# Patient Record
Sex: Female | Born: 1960
Health system: Southern US, Community
[De-identification: ages and names within clinical notes are randomized; demographics above are authoritative.]

## PROBLEM LIST (undated history)

## (undated) DIAGNOSIS — G43909 Migraine, unspecified, not intractable, without status migrainosus: Secondary | ICD-10-CM

## (undated) DIAGNOSIS — K3184 Gastroparesis: Secondary | ICD-10-CM

## (undated) DIAGNOSIS — A048 Other specified bacterial intestinal infections: Secondary | ICD-10-CM

## (undated) DIAGNOSIS — T7840XA Allergy, unspecified, initial encounter: Secondary | ICD-10-CM

## (undated) DIAGNOSIS — F419 Anxiety disorder, unspecified: Secondary | ICD-10-CM

## (undated) DIAGNOSIS — K219 Gastro-esophageal reflux disease without esophagitis: Secondary | ICD-10-CM

## (undated) DIAGNOSIS — K311 Adult hypertrophic pyloric stenosis: Secondary | ICD-10-CM

## (undated) HISTORY — DX: Migraine, unspecified, not intractable, without status migrainosus: G43.909

## (undated) HISTORY — DX: Allergy, unspecified, initial encounter: T78.40XA

## (undated) HISTORY — PX: OTHER SURGICAL HISTORY: SHX169

## (undated) HISTORY — DX: Other specified bacterial intestinal infections: A04.8

## (undated) HISTORY — PX: BACK SURGERY: SHX140

## (undated) HISTORY — DX: Adult hypertrophic pyloric stenosis: K31.1

---

## 1985-12-29 HISTORY — PX: AUGMENTATION MAMMAPLASTY: SUR837

## 1998-03-08 ENCOUNTER — Inpatient Hospital Stay (HOSPITAL_COMMUNITY): Admission: AD | Admit: 1998-03-08 | Discharge: 1998-03-10 | Payer: Self-pay | Admitting: Obstetrics and Gynecology

## 1999-03-23 ENCOUNTER — Emergency Department (HOSPITAL_COMMUNITY): Admission: EM | Admit: 1999-03-23 | Discharge: 1999-03-23 | Payer: Self-pay | Admitting: Emergency Medicine

## 1999-04-22 ENCOUNTER — Ambulatory Visit (HOSPITAL_BASED_OUTPATIENT_CLINIC_OR_DEPARTMENT_OTHER): Admission: RE | Admit: 1999-04-22 | Discharge: 1999-04-22 | Payer: Self-pay | Admitting: *Deleted

## 1999-09-25 ENCOUNTER — Other Ambulatory Visit: Admission: RE | Admit: 1999-09-25 | Discharge: 1999-09-25 | Payer: Self-pay | Admitting: Obstetrics & Gynecology

## 2000-10-22 ENCOUNTER — Other Ambulatory Visit: Admission: RE | Admit: 2000-10-22 | Discharge: 2000-10-22 | Payer: Self-pay | Admitting: Obstetrics & Gynecology

## 2001-11-04 ENCOUNTER — Other Ambulatory Visit: Admission: RE | Admit: 2001-11-04 | Discharge: 2001-11-04 | Payer: Self-pay | Admitting: Obstetrics & Gynecology

## 2003-05-30 ENCOUNTER — Other Ambulatory Visit: Admission: RE | Admit: 2003-05-30 | Discharge: 2003-05-30 | Payer: Self-pay | Admitting: Obstetrics & Gynecology

## 2004-07-24 ENCOUNTER — Other Ambulatory Visit: Admission: RE | Admit: 2004-07-24 | Discharge: 2004-07-24 | Payer: Self-pay | Admitting: Obstetrics & Gynecology

## 2006-02-24 ENCOUNTER — Other Ambulatory Visit: Admission: RE | Admit: 2006-02-24 | Discharge: 2006-02-24 | Payer: Self-pay | Admitting: Obstetrics & Gynecology

## 2010-07-08 ENCOUNTER — Ambulatory Visit: Payer: Self-pay | Admitting: Family Medicine

## 2010-08-07 ENCOUNTER — Ambulatory Visit: Payer: Self-pay | Admitting: Family Medicine

## 2011-07-03 ENCOUNTER — Telehealth: Payer: Self-pay | Admitting: Family Medicine

## 2011-07-03 NOTE — Telephone Encounter (Signed)
Patient was stung by yellow jacket on R lateral ankle 2 days ago.  Yesterday she noted a penny-sized blister at the sting site.  This afternoon she noted that her ankle appeared to be swollen to twice the normal size.  Denies any fever, redness or warmth over the swollen area.  States the area of the sting is a little itchy, denies pain.  Has had her leg somewhat elevated while reading documents today.  Had applied some deoderant over the sting, but no other treatment. (used same deoderant she uses normally).  Patient was advised to ice the area, elevate the ankle above the level of the heart, and to take antihistamines such as Zyrtec or Benadryl.  If worsening, needs evaluation.  Can call office for appt here tomorrow, vs to urgent care if worsening tonight.  Reviewed signs/symptoms of anaphylaxis for which she should seek emergent medical care

## 2011-08-27 ENCOUNTER — Encounter: Payer: Self-pay | Admitting: Family Medicine

## 2012-03-29 DIAGNOSIS — A048 Other specified bacterial intestinal infections: Secondary | ICD-10-CM

## 2012-03-29 DIAGNOSIS — K311 Adult hypertrophic pyloric stenosis: Secondary | ICD-10-CM

## 2012-03-29 HISTORY — PX: ESOPHAGOGASTRODUODENOSCOPY: SHX1529

## 2012-03-29 HISTORY — PX: COLONOSCOPY: SHX174

## 2012-03-29 HISTORY — DX: Adult hypertrophic pyloric stenosis: K31.1

## 2012-03-29 HISTORY — DX: Other specified bacterial intestinal infections: A04.8

## 2012-04-02 ENCOUNTER — Ambulatory Visit
Admission: RE | Admit: 2012-04-02 | Discharge: 2012-04-02 | Disposition: A | Discharge status: Home / Self Care | Payer: 59 | Source: Ambulatory Visit | Attending: Gastroenterology | Admitting: Gastroenterology

## 2012-04-02 ENCOUNTER — Ambulatory Visit
Admission: RE | Admit: 2012-04-02 | Discharge: 2012-04-02 | Disposition: A | Discharge status: Home / Self Care | Payer: Self-pay | Source: Ambulatory Visit | Attending: Gastroenterology | Admitting: Gastroenterology

## 2012-04-02 ENCOUNTER — Other Ambulatory Visit: Payer: Self-pay | Admitting: Gastroenterology

## 2012-04-02 DIAGNOSIS — R112 Nausea with vomiting, unspecified: Secondary | ICD-10-CM

## 2012-04-12 ENCOUNTER — Ambulatory Visit
Admission: RE | Admit: 2012-04-12 | Discharge: 2012-04-12 | Disposition: A | Discharge status: Home / Self Care | Payer: 59 | Source: Ambulatory Visit | Attending: Gastroenterology | Admitting: Gastroenterology

## 2012-04-12 ENCOUNTER — Other Ambulatory Visit: Payer: Self-pay | Admitting: Gastroenterology

## 2012-04-12 MED ORDER — IOHEXOL 300 MG/ML  SOLN
100.0000 mL | Freq: Once | INTRAMUSCULAR | Status: AC | PRN
  Administered 2012-04-12: 100 mL via INTRAVENOUS

## 2012-04-12 MED ORDER — IOHEXOL 300 MG/ML  SOLN
40.0000 mL | INTRAMUSCULAR | Status: AC
  Administered 2012-04-12: 40 mL via ORAL

## 2012-04-19 ENCOUNTER — Ambulatory Visit (INDEPENDENT_AMBULATORY_CARE_PROVIDER_SITE_OTHER): Payer: Self-pay | Admitting: General Surgery

## 2012-04-21 ENCOUNTER — Encounter: Payer: Self-pay | Admitting: Family Medicine

## 2012-04-28 ENCOUNTER — Encounter: Payer: Self-pay | Admitting: Internal Medicine

## 2012-07-22 ENCOUNTER — Ambulatory Visit: Payer: Self-pay | Admitting: General Surgery

## 2012-07-29 ENCOUNTER — Ambulatory Visit: Payer: Self-pay | Admitting: General Surgery

## 2012-10-18 ENCOUNTER — Other Ambulatory Visit: Payer: Self-pay | Admitting: Gastroenterology

## 2012-10-18 DIAGNOSIS — R109 Unspecified abdominal pain: Secondary | ICD-10-CM

## 2012-10-20 ENCOUNTER — Ambulatory Visit
Admission: RE | Admit: 2012-10-20 | Discharge: 2012-10-20 | Disposition: A | Discharge status: Home / Self Care | Payer: 59 | Source: Ambulatory Visit | Attending: Gastroenterology | Admitting: Gastroenterology

## 2012-10-20 DIAGNOSIS — R109 Unspecified abdominal pain: Secondary | ICD-10-CM

## 2012-11-10 ENCOUNTER — Other Ambulatory Visit: Payer: Self-pay | Admitting: Gastroenterology

## 2012-11-19 ENCOUNTER — Encounter (HOSPITAL_COMMUNITY)
Admission: RE | Disposition: A | Discharge status: Home / Self Care | Payer: Self-pay | Source: Ambulatory Visit | Attending: Gastroenterology

## 2012-11-19 ENCOUNTER — Ambulatory Visit (HOSPITAL_COMMUNITY): Payer: 59

## 2012-11-19 ENCOUNTER — Ambulatory Visit (HOSPITAL_COMMUNITY)
Admission: RE | Admit: 2012-11-19 | Discharge: 2012-11-19 | Disposition: A | Discharge status: Home / Self Care | Payer: 59 | Source: Ambulatory Visit | Attending: Gastroenterology | Admitting: Gastroenterology

## 2012-11-19 ENCOUNTER — Encounter (HOSPITAL_COMMUNITY): Payer: Self-pay

## 2012-11-19 ENCOUNTER — Other Ambulatory Visit (HOSPITAL_COMMUNITY): Payer: Self-pay | Admitting: Gastroenterology

## 2012-11-19 ENCOUNTER — Ambulatory Visit (HOSPITAL_COMMUNITY): Admission: RE | Admit: 2012-11-19 | Payer: 59 | Source: Ambulatory Visit | Admitting: Gastroenterology

## 2012-11-19 DIAGNOSIS — K319 Disease of stomach and duodenum, unspecified: Secondary | ICD-10-CM | POA: Insufficient documentation

## 2012-11-19 DIAGNOSIS — K311 Adult hypertrophic pyloric stenosis: Secondary | ICD-10-CM | POA: Insufficient documentation

## 2012-11-19 DIAGNOSIS — K222 Esophageal obstruction: Secondary | ICD-10-CM | POA: Insufficient documentation

## 2012-11-19 DIAGNOSIS — K56699 Other intestinal obstruction unspecified as to partial versus complete obstruction: Secondary | ICD-10-CM

## 2012-11-19 DIAGNOSIS — Z8711 Personal history of peptic ulcer disease: Secondary | ICD-10-CM | POA: Insufficient documentation

## 2012-11-19 HISTORY — PX: ESOPHAGOGASTRODUODENOSCOPY: SHX5428

## 2012-11-19 HISTORY — DX: Gastroparesis: K31.84

## 2012-11-19 SURGERY — EGD (ESOPHAGOGASTRODUODENOSCOPY)
Anesthesia: Moderate Sedation

## 2012-11-19 MED ORDER — SODIUM CHLORIDE 0.9 % IV SOLN
INTRAVENOUS | Status: DC | PRN
  Administered 2012-11-19: 10:00:00

## 2012-11-19 MED ORDER — DIPHENHYDRAMINE HCL 50 MG/ML IJ SOLN
INTRAMUSCULAR | Status: AC
  Filled 2012-11-19: qty 1

## 2012-11-19 MED ORDER — SODIUM CHLORIDE 0.9 % IV SOLN
INTRAVENOUS | Status: DC
  Administered 2012-11-19: 500 mL via INTRAVENOUS

## 2012-11-19 MED ORDER — MIDAZOLAM HCL 10 MG/2ML IJ SOLN
INTRAMUSCULAR | Status: DC | PRN
  Administered 2012-11-19 (×6): 2.5 mg via INTRAVENOUS

## 2012-11-19 MED ORDER — FENTANYL CITRATE 0.05 MG/ML IJ SOLN
INTRAMUSCULAR | Status: AC
  Filled 2012-11-19: qty 4

## 2012-11-19 MED ORDER — DIPHENHYDRAMINE HCL 50 MG/ML IJ SOLN
INTRAMUSCULAR | Status: DC | PRN
  Administered 2012-11-19 (×2): 25 mg via INTRAVENOUS

## 2012-11-19 MED ORDER — FENTANYL CITRATE 0.05 MG/ML IJ SOLN
INTRAMUSCULAR | Status: DC | PRN
  Administered 2012-11-19 (×6): 25 ug via INTRAVENOUS

## 2012-11-19 MED ORDER — SODIUM CHLORIDE 0.9 % IV SOLN: INTRAVENOUS | Status: DC

## 2012-11-19 MED ORDER — MIDAZOLAM HCL 10 MG/2ML IJ SOLN
INTRAMUSCULAR | Status: AC
  Filled 2012-11-19: qty 4

## 2012-11-19 MED ORDER — BUTAMBEN-TETRACAINE-BENZOCAINE 2-2-14 % EX AERO
INHALATION_SPRAY | CUTANEOUS | Status: DC | PRN
  Administered 2012-11-19: 2 via TOPICAL

## 2012-11-19 NOTE — H&P (Signed)
  Reason for Consult: Possible gastrojejunal anastomatic stricture Referring Physician: Jeff Medoff, M.D.  Joann Jimenez HPI: This is a 51 year old female with a history of severe peptic ulcer disease that deformed the pylorus.  As a result she developed a gastric outlet obstruction.  At the time of presentation in 03/2012 the etiology of the GOO was undetermined.  Work up by Dr. Medoff was negative for any malignancy and subsequently she underwent further evaluation and treatment at [facility].  Ultimately she required a gastrojejunal anastomosis, unfortunately, her symptoms did not completely remit.  She was improved, but over time she started to have the same symptoms as before, i.e., abdominal pain and fullness.  A repeat EGD by [facility] 07/2012 revealed a significant amount of retained food and a dilated stomach.  This was confirmed with an upper GI at [facility].  The thought was that she could have an anastomatic stricture versus gastroparesis or a combination of both etiologies.  She is here today for a repeat evaluation with potential dilation.  Past Medical History  Diagnosis Date  . Allergy     RHINITIS  . Migraine headache   . Gastric outlet obstruction 03/2012    Dr. Medoff--referred for surgical eval  . H. pylori infection 03/2012    CLO biopsy positive at EGD  . Gastroparesis     Past Surgical History  Procedure Date  . Colonoscopy 03/2012    Dr. Medoff, normal  . Esophagogastroduodenoscopy 03/2012    Dr. Medoff, gastric outlet obstruction    Family History  Problem Relation Age of Onset  . Depression Mother   . Glaucoma Mother   . Depression Father   . Depression Sister     Social History:  does not have a smoking history on file. She does not have any smokeless tobacco history on file. Her alcohol and drug histories not on file.  Allergies: No Known Allergies  Medications:  Scheduled:  Continuous:   . sodium chloride 500 mL (11/19/12 0848)  . [DISCONTINUED] sodium chloride       No results found for this or any previous visit (from the past 24 hour(s)).   No results found.  ROS:  As stated above in the HPI otherwise negative.  Blood pressure 126/76, pulse 84, temperature 98 F (36.7 C), temperature source Oral, resp. rate 18, SpO2 100.00%.    PE: Gen: NAD, Alert and Oriented HEENT:  [place]/AT, EOMI Neck: Supple, no LAD Lungs: CTA Bilaterally CV: RRR without M/G/R ABM: Soft, NTND, +BS Ext: No C/C/E  Assessment/Plan: 1) Gastric Outlet Obstruction.  Plan: 1) EGD with probable dilation of the anastomosis.  Virgene Tirone D 11/19/2012, 8:48 AM       

## 2012-11-19 NOTE — Op Note (Signed)
[facility] [address] [place], [address]   OPERATIVE PROCEDURE REPORT  PATIENT: Joann Jimenez, Joann Jimenez  MR#: [id number] BIRTHDATE: 30-Jan-1961  GENDER: Female ENDOSCOPIST: Jeani Hawking, MD ASSISTANT:   Claudie Revering, RN [facility], Karie Soda, technician, and Oletha Blend, technician PROCEDURE DATE: 11/19/2012 PROCEDURE:   Savary dilation of esophagus ASA CLASS:   Class II INDICATIONS:Gastrojejunal anastomosis. MEDICATIONS: Versed 15 mg IV, Fentanyl 150 mcg IV, and Benadryl 50 mg IV TOPICAL ANESTHETIC:   Cetacaine Spray DESCRIPTION OF PROCEDURE:   After the risks benefits and alternatives of the procedure were thoroughly explained, informed consent was obtained.  The EG-[id number] ([id number])  endoscope was introduced through the mouth  and advanced to the anastomosis Without limitations.      The instrument was slowly withdrawn as the mucosa was fully examined.  FINDINGS: Upon initial entry into the gastric lumen there was evidence of retained vegetable matter in the fundus as well as liquid contents.  Fortuantely this did not occlude visualization of the gastrojejunal anastomosis.  The anastomosis was noted to be in the posterior proximal body gastric wall.  The anastomosis was stenosed and it was estimated to be 3 mm in diameter.  An ERCP guidewire was inserted under fluoroscopy into the efferent jejunal limb.  Contrast was injected to obtain further detailing of the small bowel anatomy.  Over a guidewire a 10-12 mm dilation balloon was inserted across the stricture.  Dilation was started at 10 mm and at that diameter some free movement of the balloon was noted. The diameter was increased up to 11 mm and held in place for 1 minute.  The balloon was deflated, but the endoscope was not able to traverse the anastomosis.  The stricture was dilated up to 12 mm and subsequently the endoscope was able to pass through the area with relative ease.  Once the endoscope was  removed from the anastomosis the anastomosis seemed to collapse on itself.  At that point I felt further dilation was required.  I injected contrast to check for an anastomotic leak, which was negative.  Again, over a guidewire the 12-15 mm balloon was utilized.  I started at 13.5 mm and then felt that she would be able to tolerate the 15 mm diameter.  Post dilation was negative for any anastomotic leak and the anastomosis appeared to retain its patency.  Attention was then directed to the pylorus and I was able to pass into the channel with some minor maneuvering.  Ahead in the visualization some food debris was noted and I felt that there was an ulcer.  Because of the severity of the disease, I did not push the endoscope further. Retroflexed views revealed no abnormalities.     The scope was then withdrawn from the patient and the procedure terminated. COMPLICATIONS: There were no complications. IMPRESSION: 1) Successful dilation of the gastrojejunal anastomotic stricture. 2) Deformed pylorus..  RECOMMENDATIONS:1) PPI BID. 2) Small frequent meals.  Chew food until it is liquified. 3) Repeat dilation in 2 weeks. _______________________________ eSignedJeani Hawking, MD 11/19/2012 10:30 AM    PATIENT NAME:  Joann Jimenez, Joann Jimenez MR#: [id number]

## 2012-11-22 ENCOUNTER — Encounter (HOSPITAL_COMMUNITY): Payer: Self-pay

## 2012-11-22 ENCOUNTER — Other Ambulatory Visit: Payer: Self-pay | Admitting: Gastroenterology

## 2012-11-24 ENCOUNTER — Encounter (HOSPITAL_COMMUNITY): Payer: Self-pay | Admitting: Gastroenterology

## 2012-11-30 ENCOUNTER — Ambulatory Visit (HOSPITAL_COMMUNITY): Payer: 59

## 2012-11-30 ENCOUNTER — Ambulatory Visit (HOSPITAL_COMMUNITY)
Admission: RE | Admit: 2012-11-30 | Discharge: 2012-11-30 | Disposition: A | Discharge status: Home / Self Care | Payer: 59 | Source: Ambulatory Visit | Attending: Gastroenterology | Admitting: Gastroenterology

## 2012-11-30 ENCOUNTER — Encounter (HOSPITAL_COMMUNITY): Payer: Self-pay | Admitting: *Deleted

## 2012-11-30 ENCOUNTER — Encounter (HOSPITAL_COMMUNITY)
Admission: RE | Disposition: A | Discharge status: Home / Self Care | Payer: Self-pay | Source: Ambulatory Visit | Attending: Gastroenterology

## 2012-11-30 DIAGNOSIS — Z980 Intestinal bypass and anastomosis status: Secondary | ICD-10-CM | POA: Insufficient documentation

## 2012-11-30 DIAGNOSIS — Z8711 Personal history of peptic ulcer disease: Secondary | ICD-10-CM | POA: Insufficient documentation

## 2012-11-30 DIAGNOSIS — K259 Gastric ulcer, unspecified as acute or chronic, without hemorrhage or perforation: Secondary | ICD-10-CM | POA: Insufficient documentation

## 2012-11-30 DIAGNOSIS — Y832 Surgical operation with anastomosis, bypass or graft as the cause of abnormal reaction of the patient, or of later complication, without mention of misadventure at the time of the procedure: Secondary | ICD-10-CM | POA: Insufficient documentation

## 2012-11-30 DIAGNOSIS — K311 Adult hypertrophic pyloric stenosis: Secondary | ICD-10-CM | POA: Insufficient documentation

## 2012-11-30 DIAGNOSIS — K929 Disease of digestive system, unspecified: Secondary | ICD-10-CM | POA: Insufficient documentation

## 2012-11-30 HISTORY — PX: SAVORY DILATION: SHX5439

## 2012-11-30 HISTORY — DX: Anxiety disorder, unspecified: F41.9

## 2012-11-30 HISTORY — DX: Gastro-esophageal reflux disease without esophagitis: K21.9

## 2012-11-30 HISTORY — PX: ESOPHAGOGASTRODUODENOSCOPY: SHX5428

## 2012-11-30 IMAGING — CT CT ABDOMEN W/ CM
2 of 5 series · 15 of 46 positions shown, 17 images · IV contrast (30CC OMNI 300 & [ID] OMNI 300)
Comparison: Abdominal ultrasound 04/02/2012.

CLINICAL DATA: Abdominal pain.  Nausea and vomiting.  Question
gastric outlet obstruction versus gastric malignancy.  Endoscopy
with biopsy today.

CT HEAD WITHOUT CONTRAST
TECHNIQUE: Contiguous axial images were obtained from the base of
the skull through the vertex without intravenous contrast.

[Series 2: abdomen w/ · axial · 0.70mm/px · z∈[-307,-37]mm · 12 of 64 slices shown, 14 images]
[im 5/64  soft-tissue]
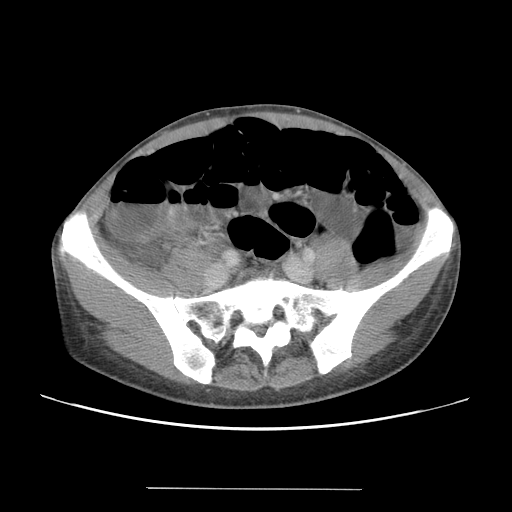
[im 5/64  bone]
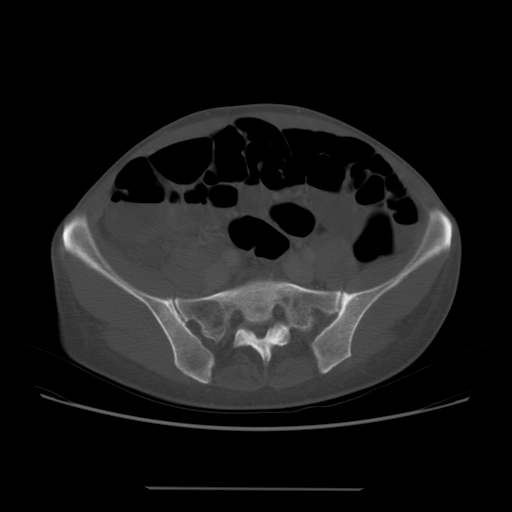
[im 9/64  soft-tissue]
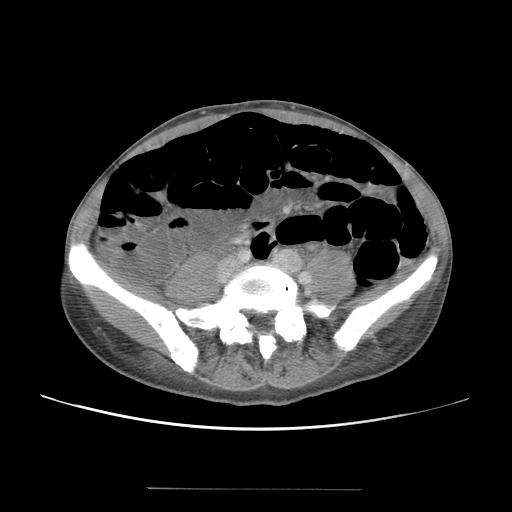
[im 13/64  soft-tissue]
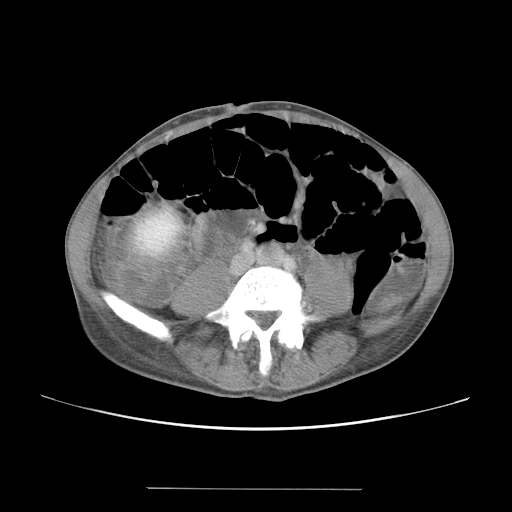
[im 22/64  soft-tissue]
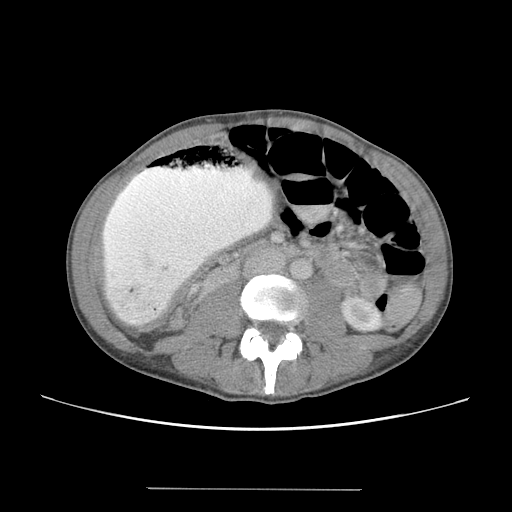
[im 26/64  soft-tissue]
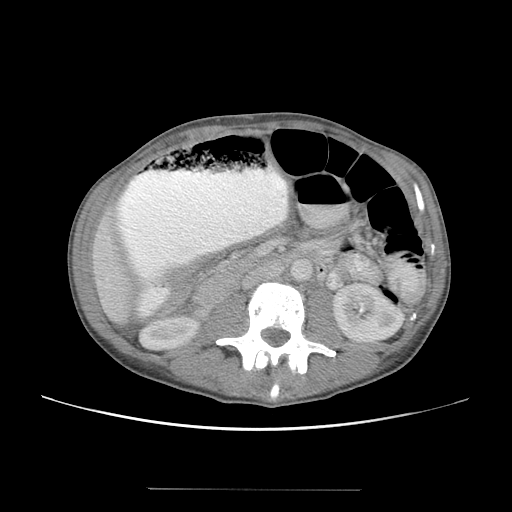
[im 30/64  soft-tissue]
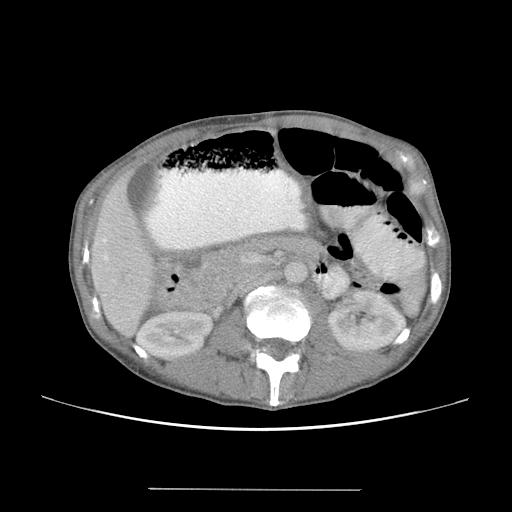
[im 34/64  soft-tissue]
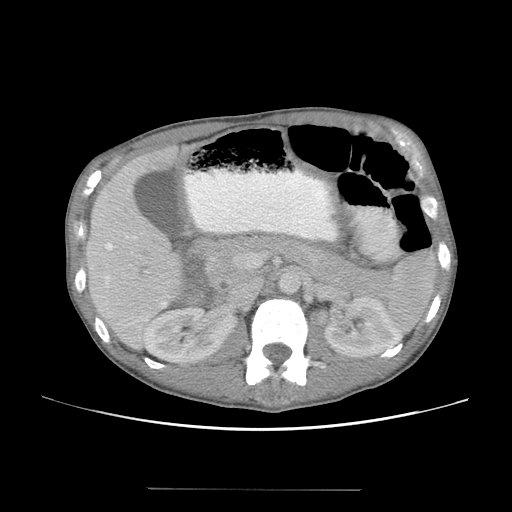
[im 38/64  soft-tissue]
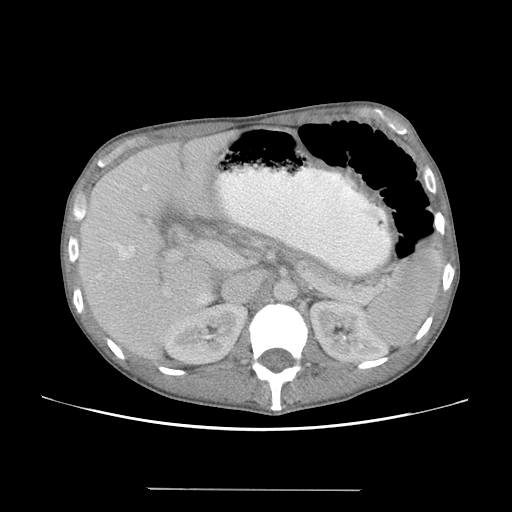
[im 43/64  soft-tissue]
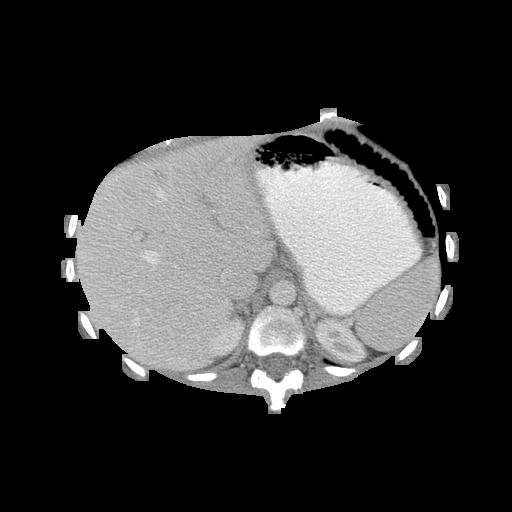
[im 43/64  bone]
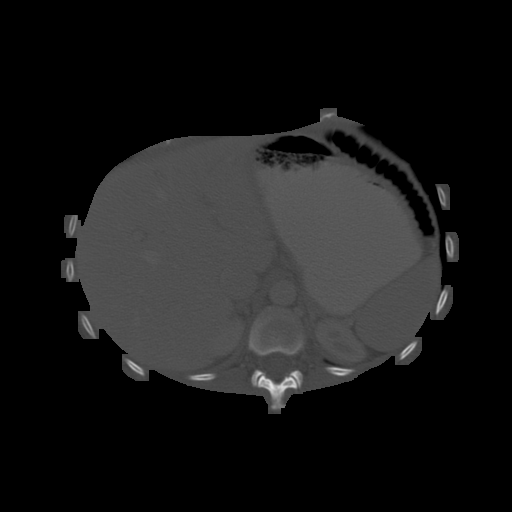
[im 51/64  soft-tissue]
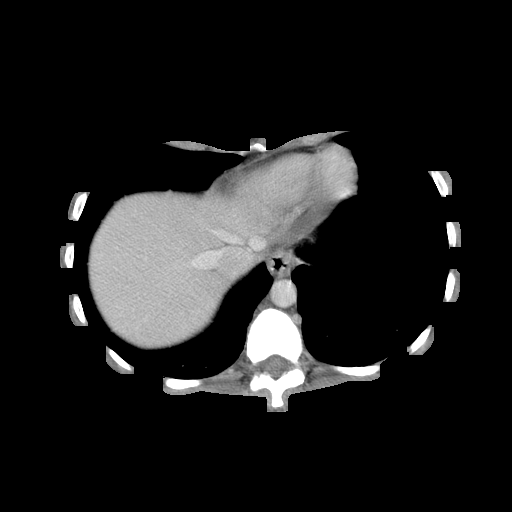
[im 55/64  soft-tissue]
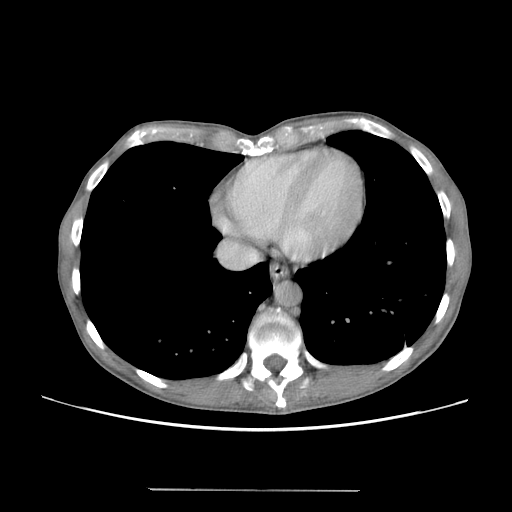
[im 59/64  soft-tissue]
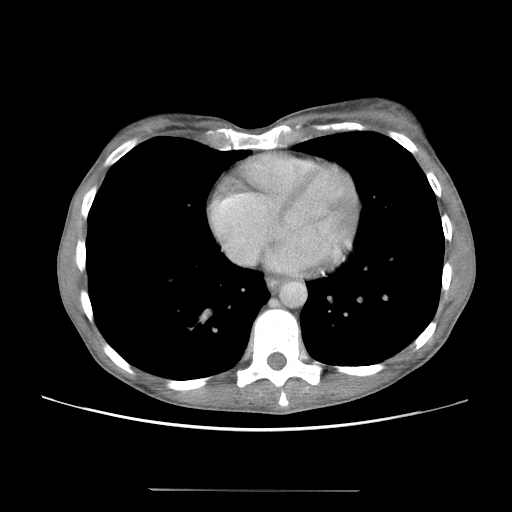

[Series 400: cor · coronal · 0.70mm/px · 3 of 101 slices shown]
[im 34/101  soft-tissue]
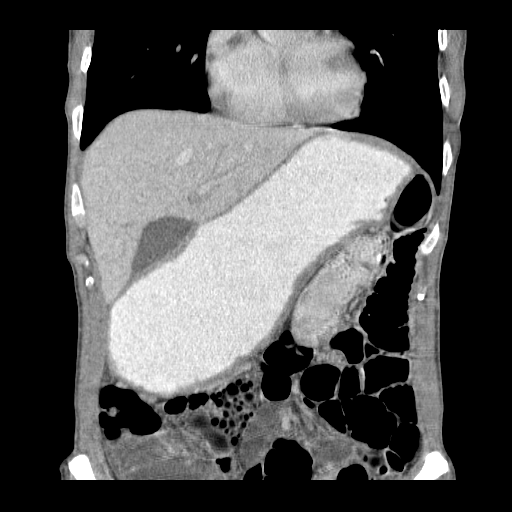
[im 45/101  soft-tissue]
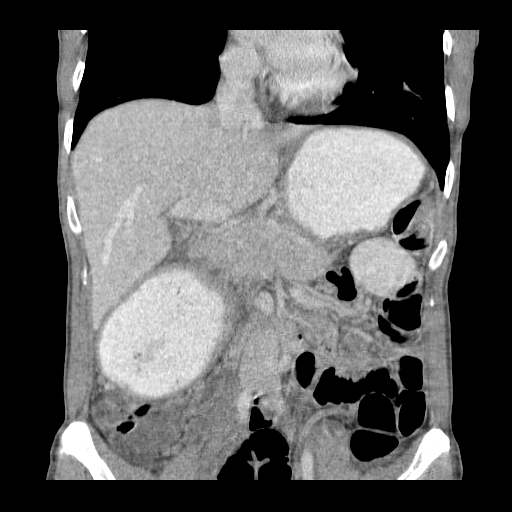
[im 56/101  soft-tissue]
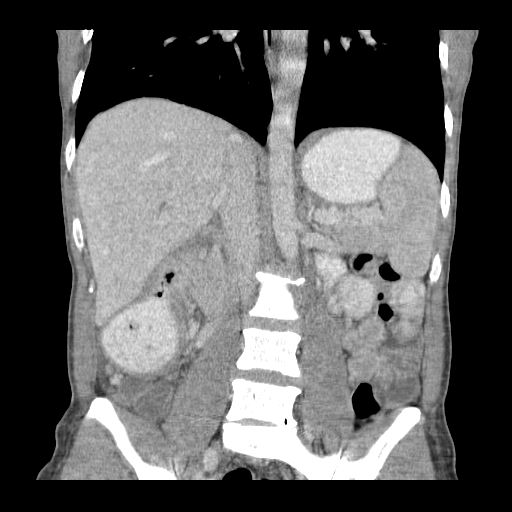

[15 of 46 positions shown; findings below may reference images not displayed]

FINDINGS: The lung bases demonstrate a cluster of multiple
irregular pulmonary nodules in the left lower lobe, the largest one
measuring approximately 5 mm on image #9.  The right lung base is
clear.  Heart size is normal.  There is no pleural effusion.

The stomach is markedly distended with oral contrast and some food
particles.  The distal stomach extends into the far lateral right
abdomen, inferior to the liver. Adjacent to and contiguous with the
expected location of the medial aspect of the pylorus vs first
portion of the duodenum is an ill-defined soft tissue mass, best
seen on image #35 of the axial images. This mass measures roughly
4.1 x 2.8 cm in axial dimension.  On the sagittal images, this soft
tissue mass measures approximately 4.2 cm in craniocaudal span.
This mass is positioned immediately anterior to the lower pole of
the right kidney.  The anterior margin of the mass is contiguous
with the inferior head of the pancreas.  There is loss of the
normal fat planes between the pancreas and duodenum.  The patient
is very thin, which does limit the evaluation of the fat planes.

An 11 x 9 mm lymph node is seen directly superior to the mass on
image number 30.  Adjacent to the distal body of the stomach is and
11 x 8 mm lymph node on image number 46.

No focal liver lesion is identified.  The liver is normal in size.
There is slight intrahepatic biliary ductal dilatation.  The
gallbladder is within normal limits.  The common bile duct measures
5 mm at the level the pancreatic head, within normal limits, and
courses along the superior and posterior medial margin of the soft
tissue mass.

Some oral contrast has progressed into the jejunal loops in the
left upper quadrant.

As noted above, abnormal soft tissue in the right abdomen  is
inseparable from the  inferior head of the pancreas.  No definite
discrete intrapancreatic mass is seen.  There is no pancreatic
ductal dilatation.  The spleen is normal in size and enhancement.
The adrenal glands and kidneys are within normal limits
bilaterally.

The imaged of small bowel loops are slightly distended with air,
possibly from nine endoscopy and colonoscopy performed today.  No
small bowel wall thickening is seen.

Abdominal aorta is normal in caliber.  No lymphadenopathy is seen
along the course of the aorta.

The imaged thoracolumbar spine is normal in height alignment.  No
acute or suspicious bony abnormality is seen.
IMPRESSION: 1.  Abnormal soft tissue mass in the right abdomen is contiguous
with and may be arising from the medial wall of the distal pylorus
versus first portion of the duodenum, and causes a partial gastric
outlet obstruction.  The anterior portion of the mass abuts  is
inseparable from the inferior head of the pancreas.  Potential
etiologies include adenocarcinoma arising from the distal stomach
or proximal duodenum, chronic inflammatory mass related to peptic
ulcer disease, or less likely pancreatic malignancy. Discussed with
Dr. Tiger by telephone [DATE] p.m. 04/12/2012.
2.  Slightly prominent lymph nodes adjacent to the mass, and distal
stomach could be reactive, or metastatic.
3.  Slight intrahepatic biliary ductal dilatation.  Common bile
duct caliber remains within normal limits.
4.  Multiple left lower lobe pulmonary nodules.  Given their
clustered appearance, these could be inflammatory/infectious in
nature.  Given the abdominal mass, metastatic nodules cannot be
excluded.  If pathology results of today's biopsy show malignancy,
a follow-up chest CT should be considered.

## 2012-11-30 SURGERY — EGD (ESOPHAGOGASTRODUODENOSCOPY)
Anesthesia: Moderate Sedation

## 2012-11-30 MED ORDER — MIDAZOLAM HCL 10 MG/2ML IJ SOLN
INTRAMUSCULAR | Status: DC | PRN
  Administered 2012-11-30 (×5): 2.5 mg via INTRAVENOUS

## 2012-11-30 MED ORDER — SUCRALFATE 1 G PO TABS: 1.0000 g | ORAL_TABLET | Freq: Four times a day (QID) | ORAL | Status: DC

## 2012-11-30 MED ORDER — SODIUM CHLORIDE 0.9 % IV SOLN
INTRAVENOUS | Status: DC | PRN
  Administered 2012-11-30: 17:00:00

## 2012-11-30 MED ORDER — DIPHENHYDRAMINE HCL 50 MG/ML IJ SOLN
INTRAMUSCULAR | Status: DC | PRN
  Administered 2012-11-30: 50 mg via INTRAVENOUS

## 2012-11-30 MED ORDER — SODIUM CHLORIDE 0.9 % IV SOLN
INTRAVENOUS | Status: DC
  Administered 2012-11-30: 15:00:00 via INTRAVENOUS

## 2012-11-30 MED ORDER — BUTAMBEN-TETRACAINE-BENZOCAINE 2-2-14 % EX AERO
INHALATION_SPRAY | CUTANEOUS | Status: DC | PRN
  Administered 2012-11-30: 2 via TOPICAL

## 2012-11-30 MED ORDER — DIPHENHYDRAMINE HCL 50 MG/ML IJ SOLN
INTRAMUSCULAR | Status: AC
  Filled 2012-11-30: qty 1

## 2012-11-30 MED ORDER — FENTANYL CITRATE 0.05 MG/ML IJ SOLN
INTRAMUSCULAR | Status: AC
  Filled 2012-11-30: qty 4

## 2012-11-30 MED ORDER — MIDAZOLAM HCL 10 MG/2ML IJ SOLN
INTRAMUSCULAR | Status: AC
  Filled 2012-11-30: qty 6

## 2012-11-30 MED ORDER — FENTANYL CITRATE 0.05 MG/ML IJ SOLN
INTRAMUSCULAR | Status: DC | PRN
  Administered 2012-11-30: 50 ug via INTRAVENOUS
  Administered 2012-11-30 (×3): 25 ug via INTRAVENOUS

## 2012-11-30 NOTE — Op Note (Signed)
[facility] [address] [place], [address]   OPERATIVE PROCEDURE REPORT  PATIENT: Joann Jimenez, Joann Jimenez  MR#: [id number] BIRTHDATE: 1961/02/26  GENDER: Female ENDOSCOPIST: Jeani Hawking, MD ASSISTANT:   Beryle Beams, technician and Claudie Revering, RN CGRN PROCEDURE DATE: 11/30/2012 PROCEDURE:   EGJ, diagnostic ASA CLASS:   Class II INDICATIONS:Anastamotic stricture. MEDICATIONS: Versed-Detailed 12.5 mg IV, Fentanyl 125 mcg IV, and Benadryl 50 mg IV TOPICAL ANESTHETIC:   Cetacaine Spray  DESCRIPTION OF PROCEDURE:   After the risks benefits and alternatives of the procedure were thoroughly explained, informed consent was obtained.  The Pentax Gastroscope [id number]  endoscope was introduced through the mouth  and advanced to the proximal jejunum Without limitations.      The instrument was slowly withdrawn as the mucosa was fully examined.  FINDINGS: A scant amount of residual vegetable matter was noted in the fundus.  Fluid was also identified and it was suctioned.  the gastrojejunal anastamosis was identified and it was noted to be patent, however, the adult endoscope was not able to traverse through the anastamosis.  A 15-18 mm balloon was deployed and with a quick dilation at 15 mm the endoscope was able to pass throug the anastamosis with ease.  The jejunum was normal in appearance.  The anastamosis was dilated up to 16.5 mm and then 18 mm.  At both of the diameters the balloon was able to be moved with some ease. Fluroscopy was employed for both higher dilations with no evidence of perforation.  Attention was directed towards the pylorus.  The adult endoscope was able to pass through proximally and at this point contrast was injected which revealed some patency.  The pediatric endoscope was then used after the adult endoscope was removed and with minor difficulty the endoscope passed into the second portion of the duodenum.  It appears that the pyloric channel  ulcer extended into the duodenal bulb, but I cannot be completely certain as the area was deformed.  The length of the circumfirential ulcer was approximately 3 cm.   Retroflexed views revealed no abnormalities.     The scope was then withdrawn from the patient and the procedure terminated.  COMPLICATIONS: There were no complications. IMPRESSION: 1) Gastrojejunal anastamosis s/p successful dilation up to 18 mm. 2) Severe pyloric channel ulceration with stenosis.  RECOMMENDATIONS: 1) Continue with Dexilant. 2) Add sucralfate 1 gram QID. 3) Follow up with Dr.  Kinnie Scales. _______________________________ eSigned:  Jeani Hawking, MD 11/30/2012 4:46 PM

## 2012-11-30 NOTE — Interval H&P Note (Signed)
History and Physical Interval Note:  11/30/2012 3:05 PM  Joann Jimenez  has presented today for surgery, with the diagnosis of dysphagia  The various methods of treatment have been discussed with the patient and family. After consideration of risks, benefits and other options for treatment, the patient has consented to  Procedure(s) (LRB) with comments: ESOPHAGOGASTRODUODENOSCOPY (EGD) (N/A) - need xray SAVORY DILATION (N/A) as a surgical intervention .  The patient's history has been reviewed, patient examined, no change in status, stable for surgery.  I have reviewed the patient's chart and labs.  Questions were answered to the patient's satisfaction.     Quanell Loughney D

## 2012-11-30 NOTE — H&P (View-Only) (Signed)
  Reason for Consult: Possible gastrojejunal anastomatic stricture Referring Physician: Ritta Slot, M.D.  Rickard Rhymes HPI: This is a 51 year old female with a history of severe peptic ulcer disease that deformed the pylorus.  As a result she developed a gastric outlet obstruction.  At the time of presentation in 03/2012 the etiology of the GOO was undetermined.  Work up by Dr. Kinnie Scales was negative for any malignancy and subsequently she underwent further evaluation and treatment at [facility].  Ultimately she required a gastrojejunal anastomosis, unfortunately, her symptoms did not completely remit.  She was improved, but over time she started to have the same symptoms as before, i.e., abdominal pain and fullness.  A repeat EGD by [facility] 07/2012 revealed a significant amount of retained food and a dilated stomach.  This was confirmed with an upper GI at [facility].  The thought was that she could have an anastomatic stricture versus gastroparesis or a combination of both etiologies.  She is here today for a repeat evaluation with potential dilation.  Past Medical History  Diagnosis Date  . Allergy     RHINITIS  . Migraine headache   . Gastric outlet obstruction 03/2012    Dr. Toney Rakes for surgical eval  . H. pylori infection 03/2012    CLO biopsy positive at EGD  . Gastroparesis     Past Surgical History  Procedure Date  . Colonoscopy 03/2012    Dr. Kinnie Scales, normal  . Esophagogastroduodenoscopy 03/2012    Dr. Kinnie Scales, gastric outlet obstruction    Family History  Problem Relation Age of Onset  . Depression Mother   . Glaucoma Mother   . Depression Father   . Depression Sister     Social History:  does not have a smoking history on file. She does not have any smokeless tobacco history on file. Her alcohol and drug histories not on file.  Allergies: No Known Allergies  Medications:  Scheduled:  Continuous:   . sodium chloride 500 mL (11/19/12 0848)  . [DISCONTINUED] sodium chloride       No results found for this or any previous visit (from the past 24 hour(s)).   No results found.  ROS:  As stated above in the HPI otherwise negative.  Blood pressure 126/76, pulse 84, temperature 98 F (36.7 C), temperature source Oral, resp. rate 18, SpO2 100.00%.    PE: Gen: NAD, Alert and Oriented HEENT:  [place]/AT, EOMI Neck: Supple, no LAD Lungs: CTA Bilaterally CV: RRR without M/G/R ABM: Soft, NTND, +BS Ext: No C/C/E  Assessment/Plan: 1) Gastric Outlet Obstruction.  Plan: 1) EGD with probable dilation of the anastomosis.  Amyr Sluder D 11/19/2012, 8:48 AM

## 2012-12-01 ENCOUNTER — Encounter (HOSPITAL_COMMUNITY): Payer: Self-pay | Admitting: Gastroenterology

## 2012-12-01 ENCOUNTER — Encounter (HOSPITAL_COMMUNITY): Payer: Self-pay

## 2012-12-29 ENCOUNTER — Ambulatory Visit: Payer: Self-pay | Admitting: General Surgery

## 2013-01-29 ENCOUNTER — Ambulatory Visit: Payer: Self-pay | Admitting: General Surgery

## 2013-02-15 ENCOUNTER — Other Ambulatory Visit: Payer: Self-pay | Admitting: Gastroenterology

## 2013-02-15 DIAGNOSIS — R109 Unspecified abdominal pain: Secondary | ICD-10-CM

## 2013-02-18 ENCOUNTER — Ambulatory Visit
Admission: RE | Admit: 2013-02-18 | Discharge: 2013-02-18 | Disposition: A | Discharge status: Home / Self Care | Payer: 59 | Source: Ambulatory Visit | Attending: Gastroenterology | Admitting: Gastroenterology

## 2013-02-18 DIAGNOSIS — R109 Unspecified abdominal pain: Secondary | ICD-10-CM

## 2013-02-18 MED ORDER — IOHEXOL 300 MG/ML  SOLN
100.0000 mL | Freq: Once | INTRAMUSCULAR | Status: AC | PRN
  Administered 2013-02-18: 100 mL via INTRAVENOUS

## 2013-05-18 ENCOUNTER — Ambulatory Visit (INDEPENDENT_AMBULATORY_CARE_PROVIDER_SITE_OTHER): Payer: 59 | Admitting: Family Medicine

## 2013-05-18 ENCOUNTER — Encounter: Payer: Self-pay | Admitting: Family Medicine

## 2013-05-18 VITALS — BP 104/64 | HR 92 | Temp 100.8°F | Ht 67.0 in | Wt 132.0 lb

## 2013-05-18 DIAGNOSIS — W57XXXA Bitten or stung by nonvenomous insect and other nonvenomous arthropods, initial encounter: Secondary | ICD-10-CM

## 2013-05-18 DIAGNOSIS — R509 Fever, unspecified: Secondary | ICD-10-CM

## 2013-05-18 LAB — CBC WITH DIFFERENTIAL/PLATELET
Eosinophils Absolute: 0.2 10*3/uL (ref 0.0–0.7)
Eosinophils Relative: 1 % (ref 0–5)
Hemoglobin: 12.7 g/dL (ref 12.0–15.0)
Lymphocytes Relative: 11 % — ABNORMAL LOW (ref 12–46)
Lymphs Abs: 1.5 10*3/uL (ref 0.7–4.0)
MCH: 30.2 pg (ref 26.0–34.0)
MCV: 90.3 fL (ref 78.0–100.0)
Monocytes Relative: 16 % — ABNORMAL HIGH (ref 3–12)
RBC: 4.21 MIL/uL (ref 3.87–5.11)
WBC: 14.2 10*3/uL — ABNORMAL HIGH (ref 4.0–10.5)

## 2013-05-18 MED ORDER — DOXYCYCLINE HYCLATE 100 MG PO TABS: 100.0000 mg | ORAL_TABLET | Freq: Two times a day (BID) | ORAL | Status: DC

## 2013-05-18 NOTE — Patient Instructions (Addendum)
Start the antibiotics--remember to be very careful in the sun, use sunscreen.  Take for full 10 days, unless you have a severe reaction or side effect. We will contact you with results--might take a week to get.

## 2013-05-18 NOTE — Progress Notes (Signed)
Chief Complaint  Patient presents with  . Fever    a week ago Sunday started feeling achy and had a low grade fever. Next day stomach hurt. Felt better Tuesday. Felt sick again Tuesday night, fever came back Tues night and Wed. Layed low the rest of the week and went for run Monday after work and all symptoms came back. Started feeling feverish yesterday, starting to creep back up today. When she has the fever she feels achy and tired, when she is not having fever she feels fine. Had GI surgery last year.  Tick bite last month.    Began feeling sick on 5/11--some low grade fever, abdominal pain and decreased appetite.  Has been feeling achey, generalized.  By 5/13 her fever had broken, she was able to go to work, take a Engineer, maintenance, but felt bad again by that evening.  Recurrent low grade fevers on 5/14, but "broke" and gone again by 5/15.  She avoided exercise until 5/19 when she went for a short easy run.  By mid-day Tuesday 5/20 she had decreased appetite, felt "crummy" and had a fever up to 101.    She is having some sinus pain, postnasal drainage and mild sore throat.  Not blowing nose or coughing up any phlegm.  Has a mild dry cough.  Denies any diarrhea, blood in stools.  Has some mild epigastric pain only when she has the fevers, along with decreased appetite, both of which improve when fever is gone.  Denies urinary urgency, frequency or dysuria.  Denies any vaginal discharge, rashes.  She had a tick bite 1 month ago.  Thinks the tick was attached for 1-2 days at R upper buttock.  There was never any rash.  Has had some persistent itching at the area of tick bite.  She has had many tick bites in past.  No sick contacts--had a friend with flu-like symptoms that lasted just for a week. Getting emotional somatic therapy.  Her therapist thinks it is related to this therapy, and not a physical illness, but patient wants to r/o physical illness.  Severe pain behind her L scapula last night, which  resolved when fever broke.  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  She gets migraines sometimes when she has fevers--she has had a couple of migraines since fevers started, but otherwise no severe headaches.  Past Medical History  Diagnosis Date  . Allergy     RHINITIS  . Migraine headache   . Gastric outlet obstruction 03/2012    Dr. Toney Rakes for surgical eval  . H. pylori infection 03/2012    CLO biopsy positive at EGD  . Gastroparesis   . Anxiety   . GERD (gastroesophageal reflux disease)    Past Surgical History  Procedure Laterality Date  . Colonoscopy  03/2012    Dr. Kinnie Scales, normal  . Esophagogastroduodenoscopy  03/2012    Dr. Kinnie Scales, gastric outlet obstruction  . Esophagogastroduodenoscopy  11/19/2012    Procedure: ESOPHAGOGASTRODUODENOSCOPY (EGD);  Surgeon: Theda Belfast, MD;  Location: [facility];  Service: Endoscopy;  Laterality: N/A;  EGD with dilation  . Back surgery    . Cesarean section    . Gastric outlet obstruction surgery    . Esophagogastroduodenoscopy  11/30/2012    Procedure: ESOPHAGOGASTRODUODENOSCOPY (EGD);  Surgeon: Theda Belfast, MD;  Location: [facility];  Service: Endoscopy;  Laterality: N/A;  need xray  . Savory dilation  11/30/2012    Procedure:  SAVORY DILATION;  Surgeon: Theda Belfast, MD;  Location: [facility];  Service: Endoscopy;  Laterality: N/A;   History   Social History  . Marital Status: Single    Spouse Name: N/A    Number of Children: N/A  . Years of Education: N/A   Occupational History  . Not on file.   Social History Main Topics  . Smoking status: Never Smoker   . Smokeless tobacco: Not on file  . Alcohol Use: Yes     Comment: once a month  . Drug Use: No  . Sexually Active:    Other Topics Concern  . Not on file   Social History Narrative  . No narrative on file   Current outpatient prescriptions:clonazePAM (KLONOPIN) 0.5 MG tablet, Take 0.5 mg by mouth 2  (two) times daily as needed., Disp: , Rfl: ;  dexlansoprazole (DEXILANT) 60 MG capsule, Take 60 mg by mouth 2 (two) times daily. , Disp: , Rfl: ;  fluticasone (FLONASE) 50 MCG/ACT nasal spray, Place 2 sprays into the nose daily.  , Disp: , Rfl: ;  levocetirizine (XYZAL) 2.5 MG/5ML solution, Take 2.5 mg by mouth every evening., Disp: , Rfl:  mirtazapine (REMERON) 30 MG tablet, Take 30 mg by mouth at bedtime. , Disp: , Rfl: ;  perphenazine (TRILAFON) 4 MG tablet, Take 4 mg by mouth as needed. , Disp: , Rfl:   No Known Allergies  ROS:  See HPI.  Denies vomiting, diarrhea, skin rash, bleeding/bruising, chest pain, shortness of breath, sore throat, arthralgias.  PHYSICAL EXAM: BP 104/64  Pulse 92  Temp(Src) 100.8 F (38.2 C) (Tympanic)  Ht 5\' 7"  (1.702 m)  Wt 132 lb (59.875 kg)  BMI 20.67 kg/m2  LMP 05/08/2013 Well developed, thin, pleasant female in no distress HEENT:  PERRL, EOMI, conjunctiva clear.  TM's and EAC's normal. Nasal mucosa mildly edematous, no erythema, slight clear mucus.  Sinuses nontender.  Some cobblestoning posteriorly, otherwise normal OP exam. Neck: no lymphadenopathy, thyromegaly or mass Heart: regular rate and rhythm without murmur Lungs: clear bilaterally Back: no spine or CVA tenderness Abdomen: soft.  Mild epigastric tenderness without mass, rebound or guarding.  No hepatosplenomegaly.  Normal bowel sounds.  Extremities: no edema Skin: 2 small scabbed lesions upper right buttock.  No crusting or surrounding erythema or soft tissue swelling.  Area that pt states tick was attached is higher up, and this area has completely resolved with no rash  ASSESSMENT/PLAN:  Tick bite - Plan: CBC with Differential, Rocky mtn spotted fvr abs pnl(IgG+IgM), B. Burgdorfi Antibodies, doxycycline (VIBRA-TABS) 100 MG tablet  Febrile illness - Plan: CBC with Differential, Rocky mtn spotted fvr abs pnl(IgG+IgM), B. Burgdorfi Antibodies, doxycycline (VIBRA-TABS) 100 MG  tablet  Intermittent fevers x 10 days, with some vague symptoms of achiness, abdominal pain and decreased appetite, otherwise without any real focal symptoms of infection.  Recent tick bite, no rash  Treat presumptively with doxycycline 100mg  BID x 10 days CBC, and titers for RMSF and Lyme dz.  Doxy would cover both.  Risks and side effects of meds reviewed. Counseled re: why we should treat presumptively, rather than waiting for titers (may need to wait a long time for convalescent titers, should treat sooner).   At some point return for NV for TdaP, vs doing at next physical.  Don't recommend giving with acute febrile illness.

## 2013-05-19 LAB — ROCKY MTN SPOTTED FVR ABS PNL(IGG+IGM): RMSF IgG: 0.49 IV

## 2013-06-20 ENCOUNTER — Telehealth: Payer: Self-pay | Admitting: Internal Medicine

## 2013-06-20 NOTE — Telephone Encounter (Signed)
F/u tomorrow to discuss then

## 2013-06-20 NOTE — Telephone Encounter (Signed)
She found the yesterday around 12 and removed around 8am this morning. She states it was a deer tick. She is not having any rash or fever.

## 2013-06-20 NOTE — Telephone Encounter (Signed)
Tomorrow is fine.   Does she have any idea how long tick was on her?  Any rash, fever, symptoms?

## 2013-06-20 NOTE — Telephone Encounter (Signed)
Pt states she got a tick off her yesterday evening and she seen Dr Lynelle Doctor for tick bite before and she said Dr. Lynelle Doctor told her to come in within 24 hours and she can get put on something. Pt is scheduled for tomorrow morning with you. Pt wants to know will it be to late to be seen or does she need to go somewhere else.

## 2013-06-21 ENCOUNTER — Ambulatory Visit (INDEPENDENT_AMBULATORY_CARE_PROVIDER_SITE_OTHER): Payer: 59 | Admitting: Medical

## 2013-06-21 ENCOUNTER — Encounter: Payer: Self-pay | Admitting: Medical

## 2013-06-21 VITALS — BP 92/60 | HR 68 | Temp 97.5°F | Resp 16 | Wt 136.0 lb

## 2013-06-21 DIAGNOSIS — W57XXXA Bitten or stung by nonvenomous insect and other nonvenomous arthropods, initial encounter: Secondary | ICD-10-CM

## 2013-06-21 DIAGNOSIS — Z23 Encounter for immunization: Secondary | ICD-10-CM

## 2013-06-21 MED ORDER — DOXYCYCLINE HYCLATE 200 MG PO TBEC: 1.0000 | DELAYED_RELEASE_TABLET | Freq: Once | ORAL | Status: DC

## 2013-06-21 NOTE — Addendum Note (Signed)
Addended by: Janeice Robinson on: 06/21/2013 08:43 AM   Modules accepted: Orders

## 2013-06-21 NOTE — Progress Notes (Signed)
Subjective: Here for c/o tick bite.  Was here few months ago worried about lyme disease.  symptoms resolved, titers were all negative, but improved with Doxycycline.  Was advised that in the future can use prophylactic measure for tick bite.  Had tick on her Sunday, was outdoors in the woods.  But was also in the woods Friday too, but thinks she got it on her Sunday.  Was a small deer tick.  Pulled the tick off Monday morning when she noticed the tick.  Was not big and engorged.  Bite was on top of right hip area.  No rash, no current symptoms, feels fine.  Has itching at the bite and mosquito bite like lesion, but just concerned about whether she should be on prophylactic medication.   Also wants Tdap vaccine .  Last was >10 years ago.   Past Medical History  Diagnosis Date  . Allergy     RHINITIS  . Migraine headache   . Gastric outlet obstruction 03/2012    Dr. Toney Rakes for surgical eval  . H. pylori infection 03/2012    CLO biopsy positive at EGD  . Gastroparesis   . Anxiety   . GERD (gastroesophageal reflux disease)    ROS as in subjective  Objective: Gen: wd, wn, nad Skin; right upper lateral hip with small 3mm raised papular erythematous lesion c/w insect bite.  No other rash  Assessment: Encounter Diagnoses  Name Primary?  . Tick bite Yes  . Need for Tdap vaccination    Plan: Tick bite - she is very concerned about potential for lyme/RMSF, given recent scare with symptoms after tick bite in May.  Given the unknown time frame tick was attached and the fact we are still within 72 hours of the bite, will use a single prophylactic dose of Doxycycline.   Tdap vaccine, VIS and counseling given.

## 2013-06-21 NOTE — Telephone Encounter (Signed)
Patient had a office visit this morning about her tick bite. CLS

## 2014-02-27 ENCOUNTER — Other Ambulatory Visit: Payer: Self-pay | Admitting: Obstetrics & Gynecology

## 2014-02-27 DIAGNOSIS — R928 Other abnormal and inconclusive findings on diagnostic imaging of breast: Secondary | ICD-10-CM

## 2014-02-28 ENCOUNTER — Ambulatory Visit
Admission: RE | Admit: 2014-02-28 | Discharge: 2014-02-28 | Disposition: A | Discharge status: Home / Self Care | Payer: 59 | Source: Ambulatory Visit | Attending: Obstetrics & Gynecology | Admitting: Obstetrics & Gynecology

## 2014-02-28 DIAGNOSIS — R928 Other abnormal and inconclusive findings on diagnostic imaging of breast: Secondary | ICD-10-CM

## 2014-03-09 ENCOUNTER — Other Ambulatory Visit: Payer: Self-pay | Admitting: Obstetrics & Gynecology

## 2014-03-24 ENCOUNTER — Ambulatory Visit (INDEPENDENT_AMBULATORY_CARE_PROVIDER_SITE_OTHER): Payer: 59 | Admitting: Family Medicine

## 2014-03-24 ENCOUNTER — Encounter: Payer: Self-pay | Admitting: Family Medicine

## 2014-03-24 VITALS — BP 120/84 | HR 80

## 2014-03-24 DIAGNOSIS — S91009A Unspecified open wound, unspecified ankle, initial encounter: Secondary | ICD-10-CM

## 2014-03-24 DIAGNOSIS — S81009A Unspecified open wound, unspecified knee, initial encounter: Secondary | ICD-10-CM

## 2014-03-24 DIAGNOSIS — W540XXA Bitten by dog, initial encounter: Secondary | ICD-10-CM

## 2014-03-24 DIAGNOSIS — S81809A Unspecified open wound, unspecified lower leg, initial encounter: Secondary | ICD-10-CM

## 2014-03-24 DIAGNOSIS — S81851A Open bite, right lower leg, initial encounter: Secondary | ICD-10-CM

## 2014-03-24 DIAGNOSIS — S91051A Open bite, right ankle, initial encounter: Secondary | ICD-10-CM

## 2014-03-24 MED ORDER — AMOXICILLIN-POT CLAVULANATE 875-125 MG PO TABS: 1.0000 | ORAL_TABLET | Freq: Two times a day (BID) | ORAL | Status: DC

## 2014-03-24 NOTE — Progress Notes (Signed)
   Subjective:    Patient ID: Joann Jimenez, female    DOB: Jul 15, 1961, 53 y.o.   MRN: [id number]  HPI Earlier today she was trying to pull to of her dogs apart that were fighting and one of them attacked her right lower leg. She sustained several lacerations and abrasions to the right lower ankle and calf area.   Review of Systems     Objective:   Physical Exam 4 lacerations were noted, 3 medially. One was 2 inches, three quarters of an inch and 1 inch. There was all lateral laceration 1 inch in length. A photograph was taken of the lesions.       Assessment & Plan:  Dog bite of right ankle  Dog bite of right calf  all of the wounds were thoroughly cleaned with Betadine and inspected. No underlying structures were noted to be damaged in any of the wounds. Total length of 34 lacerations was 4 3/4 inches with 10 sutures applied. She will be placed on Augmentin. She was cautioned on the possibility of this becoming infected and will call me over the weekend if needed otherwise I will see her on Monday.

## 2014-03-27 ENCOUNTER — Ambulatory Visit (INDEPENDENT_AMBULATORY_CARE_PROVIDER_SITE_OTHER): Payer: 59 | Admitting: Family Medicine

## 2014-03-27 ENCOUNTER — Encounter: Payer: Self-pay | Admitting: Family Medicine

## 2014-03-27 VITALS — BP 120/84 | HR 88

## 2014-03-27 DIAGNOSIS — S81851A Open bite, right lower leg, initial encounter: Secondary | ICD-10-CM

## 2014-03-27 DIAGNOSIS — S81809A Unspecified open wound, unspecified lower leg, initial encounter: Secondary | ICD-10-CM

## 2014-03-27 DIAGNOSIS — S91051A Open bite, right ankle, initial encounter: Secondary | ICD-10-CM

## 2014-03-27 DIAGNOSIS — S81009A Unspecified open wound, unspecified knee, initial encounter: Secondary | ICD-10-CM

## 2014-03-27 DIAGNOSIS — S91009A Unspecified open wound, unspecified ankle, initial encounter: Secondary | ICD-10-CM

## 2014-03-27 DIAGNOSIS — W540XXA Bitten by dog, initial encounter: Secondary | ICD-10-CM

## 2014-03-27 NOTE — Progress Notes (Signed)
   Subjective:    Patient ID: Joann Jimenez, female    DOB: [birth year], 53 y.o.   MRN: [id number]  HPI She is here for recheck. She has noted some pain and swelling in the right ankle. She notes especially having difficulty when she gets out of bed stay the first few steps can be quite painful. She continues on the antibiotic without difficulty.  Review of Systems     Objective:   Physical Exam Exam of the right leg does show the lesions to be healing with no evidence of erythema warmth or tenderness. There is some discoloration to the medial aspect of the ankle.       Assessment & Plan:  Dog bite of right ankle  Dog bite of right calf  continue on the antibiotic. Continue to wrap the area for protection. Recommend increasing use of the foot and ankle based on her discomfort. I will remove the stitches in one week. She will call if further difficulty prior to that.

## 2014-04-03 ENCOUNTER — Encounter: Payer: Self-pay | Admitting: Family Medicine

## 2014-04-03 ENCOUNTER — Ambulatory Visit (INDEPENDENT_AMBULATORY_CARE_PROVIDER_SITE_OTHER): Payer: 59 | Admitting: Family Medicine

## 2014-04-03 VITALS — BP 110/68 | HR 80 | Wt 144.0 lb

## 2014-04-03 DIAGNOSIS — W540XXA Bitten by dog, initial encounter: Secondary | ICD-10-CM

## 2014-04-03 DIAGNOSIS — S81851A Open bite, right lower leg, initial encounter: Secondary | ICD-10-CM

## 2014-04-03 DIAGNOSIS — S91009A Unspecified open wound, unspecified ankle, initial encounter: Secondary | ICD-10-CM

## 2014-04-03 DIAGNOSIS — S81809A Unspecified open wound, unspecified lower leg, initial encounter: Secondary | ICD-10-CM

## 2014-04-03 DIAGNOSIS — S81009A Unspecified open wound, unspecified knee, initial encounter: Secondary | ICD-10-CM

## 2014-04-03 DIAGNOSIS — S91051A Open bite, right ankle, initial encounter: Secondary | ICD-10-CM

## 2014-04-03 NOTE — Progress Notes (Signed)
   Subjective:    Patient ID: Joann Jimenez, female    DOB: November 21, 1961, 53 y.o.   MRN: [id number]  HPI She is here for a recheck. She does note that there is slight erythema but no warmth or tenderness around the sutures.   Review of Systems     Objective:   Physical Exam Erythema and a well demarcated areas noted around the wounds. It is not warm or tender. Sutures were removed without difficulty. The erythema was marked with an Jimenez pen.       Assessment & Plan:  Dog bite of right ankle  Dog bite of right calf  she is to call me if the erythema gets worse. I explained that this is probably a stitch reaction and hopefully this will go way but if not I will place her on an antibiotic again.

## 2014-05-29 ENCOUNTER — Encounter: Payer: Self-pay | Admitting: Family Medicine

## 2014-05-29 ENCOUNTER — Ambulatory Visit (INDEPENDENT_AMBULATORY_CARE_PROVIDER_SITE_OTHER): Payer: 59 | Admitting: Family Medicine

## 2014-05-29 VITALS — BP 110/74 | HR 72 | Wt 139.0 lb

## 2014-05-29 DIAGNOSIS — W57XXXA Bitten or stung by nonvenomous insect and other nonvenomous arthropods, initial encounter: Secondary | ICD-10-CM

## 2014-05-29 DIAGNOSIS — G43009 Migraine without aura, not intractable, without status migrainosus: Secondary | ICD-10-CM

## 2014-05-29 DIAGNOSIS — T148 Other injury of unspecified body region: Secondary | ICD-10-CM

## 2014-05-29 DIAGNOSIS — Z8711 Personal history of peptic ulcer disease: Secondary | ICD-10-CM

## 2014-05-29 MED ORDER — ZOLMITRIPTAN 5 MG NA SOLN: 1.0000 | NASAL | Status: DC

## 2014-05-29 NOTE — Progress Notes (Signed)
   Subjective:    Patient ID: Joann Jimenez, female    DOB: [birth year], 53 y.o.   MRN: [id number]  HPI She has a history of migraine headaches and presently is taking Zomig nasal spray.. She uses 2 or 3 per month. He has tried Topamax and had unacceptable side effects. She states he usually gets these migraines in and around the time of her cycle. She uses the medicine appropriately. She was told not to use more than 2 per week. She does know what triggers can cause this including sleep disturbance, stress, sunshine and her menstrual cycles.  She uses ondansetron for the nausea.She has a history of ulcer disease with surgery and knows to avoid NSAID's. She also has several tick bites that she describes is quite small on her right lower extremity.   Review of Systems     Objective:   Physical Exam Alert and in no distress. He does have several small healing lesions present on her lower extremity. There is no evidence of rash.       Assessment & Plan:  H/O peptic ulcer  Migraine headache without aura - Plan: zolmitriptan (ZOMIG) 5 MG nasal solution  Tick bites  she has a very good feel for how to take care of her migraine headaches. I will therefore prescribe her Zomig. Also she will call if she needs more ondansetron. Discussed treatment of tick bites and at this point do not feel the need to treat her with doxycycline.

## 2014-06-05 ENCOUNTER — Ambulatory Visit (INDEPENDENT_AMBULATORY_CARE_PROVIDER_SITE_OTHER): Payer: 59 | Admitting: Family Medicine

## 2014-06-05 ENCOUNTER — Encounter: Payer: Self-pay | Admitting: Family Medicine

## 2014-06-05 VITALS — Temp 98.2°F | Wt 143.0 lb

## 2014-06-05 DIAGNOSIS — R509 Fever, unspecified: Secondary | ICD-10-CM

## 2014-06-05 DIAGNOSIS — S90569A Insect bite (nonvenomous), unspecified ankle, initial encounter: Secondary | ICD-10-CM

## 2014-06-05 DIAGNOSIS — S80869A Insect bite (nonvenomous), unspecified lower leg, initial encounter: Secondary | ICD-10-CM

## 2014-06-05 DIAGNOSIS — W57XXXA Bitten or stung by nonvenomous insect and other nonvenomous arthropods, initial encounter: Principal | ICD-10-CM

## 2014-06-05 MED ORDER — DOXYCYCLINE HYCLATE 100 MG PO TABS: 100.0000 mg | ORAL_TABLET | Freq: Two times a day (BID) | ORAL | Status: DC

## 2014-06-05 NOTE — Progress Notes (Signed)
   Subjective:    Patient ID: Joann Jimenez, female    DOB: 1960-12-30, 53 y.o.   MRN: [id number]  HPI She is here for recheck. She had tick bites approximately 3 weeks ago. She had no evidence of EM however since then she has developed intermittent fever as well as myalgias, no arthralgias, rash, swelling, headache   Review of Systems     Objective:   Physical Exam alert and in no distress. Tympanic membranes and canals are normal. Throat is clear. Tonsils are normal. Neck is supple without adenopathy or thyromegaly. Cardiac exam shows a regular sinus rhythm without murmurs or gallops. Lungs are clear to auscultation. Exam of her skin shows no evidence of EM        Assessment & Plan:  Tick bite of calf - Plan: Joann Jimenez. Blt. IgG & IgM w/bands, CANCELED: Joann IGG/IGM AB, CANCELED: Joann Ab/Western Blot Reflex  Febrile illness - Plan: doxycycline (VIBRA-TABS) 100 MG tablet  I discussed in detail last night and today her particular risk for Joann disease which is probably low. I will start her on doxycycline more of course and then truly think she has it. Hopefully the blood work will help [place] with that diagnosis. She is comfortable with this approach.

## 2014-06-06 ENCOUNTER — Telehealth: Payer: Self-pay

## 2014-06-06 LAB — LYME ABY, WSTRN BLT IGG & IGM W/BANDS
B burgdorferi IgG Abs (IB): NEGATIVE
B burgdorferi IgM Abs (IB): NEGATIVE
LYME DISEASE 18 KD IGG: NONREACTIVE
LYME DISEASE 23 KD IGM: NONREACTIVE
LYME DISEASE 30 KD IGG: NONREACTIVE
LYME DISEASE 39 KD IGG: NONREACTIVE
LYME DISEASE 41 KD IGG: NONREACTIVE
LYME DISEASE 58 KD IGG: NONREACTIVE
Lyme Disease 23 kD IgG: NONREACTIVE
Lyme Disease 28 kD IgG: NONREACTIVE
Lyme Disease 39 kD IgM: NONREACTIVE
Lyme Disease 41 kD IgM: NONREACTIVE
Lyme Disease 45 kD IgG: NONREACTIVE
Lyme Disease 66 kD IgG: NONREACTIVE
Lyme Disease 93 kD IgG: NONREACTIVE

## 2014-06-06 NOTE — Telephone Encounter (Signed)
Pt states she went off of her Klonopin in January and was authorized to wean off of it by her Psychologist and she went to her GI doc for her ulcers and he told her to get back on her Klonopin right away. Her Psychologist is now retired and is wanting to know if you can write her a Rx for Klonopin until she can see mental health at the end of July.

## 2014-06-06 NOTE — Telephone Encounter (Signed)
Pt states her GYN is going to write Rx for her so disregard message

## 2014-06-07 ENCOUNTER — Telehealth: Payer: Self-pay | Admitting: Internal Medicine

## 2014-06-07 NOTE — Telephone Encounter (Signed)
Tell her that I am glad she's feeling better and have her take the full dosing of the antibiotic

## 2014-06-07 NOTE — Telephone Encounter (Signed)
Pt states that her WBC was 14,000 from another blood test and her fever is gone and she feels about 90% better. She is returning your call from yesterday.

## 2014-06-08 NOTE — Telephone Encounter (Signed)
LM on pt VM with message

## 2014-09-08 ENCOUNTER — Ambulatory Visit (INDEPENDENT_AMBULATORY_CARE_PROVIDER_SITE_OTHER): Payer: 59 | Admitting: Medical

## 2014-09-08 ENCOUNTER — Encounter: Payer: Self-pay | Admitting: Medical

## 2014-09-08 VITALS — BP 92/60 | HR 112 | Temp 99.0°F | Resp 16 | Wt 137.0 lb

## 2014-09-08 DIAGNOSIS — R509 Fever, unspecified: Secondary | ICD-10-CM

## 2014-09-08 DIAGNOSIS — J209 Acute bronchitis, unspecified: Secondary | ICD-10-CM

## 2014-09-08 DIAGNOSIS — J029 Acute pharyngitis, unspecified: Secondary | ICD-10-CM

## 2014-09-08 LAB — POCT INFLUENZA A/B
INFLUENZA A, POC: NEGATIVE
INFLUENZA B, POC: NEGATIVE

## 2014-09-08 LAB — POCT RAPID STREP A (OFFICE): Rapid Strep A Screen: NEGATIVE

## 2014-09-08 MED ORDER — AZITHROMYCIN 250 MG PO TABS: ORAL_TABLET | ORAL | Status: DC

## 2014-09-08 MED ORDER — HYDROCODONE-HOMATROPINE 5-1.5 MG/5ML PO SYRP: 5.0000 mL | ORAL_SOLUTION | Freq: Three times a day (TID) | ORAL | Status: DC | PRN

## 2014-09-08 NOTE — Progress Notes (Signed)
Subjective: Here for illness.  Just got back from [place] trip Sunday night. sick contacts include 1 person was coming down with similar symptoms with runny nose and cough.   Her symptoms started with abrupt onset of cough, sore throat, head congestion, chest congestion, fever of 101.1, felt run down, exhausted 3 days ago.  She has been that way since.   In addition to those symptoms has headache, diarrhea, sore throat, glands swollen, cough keeps her up all night, some phlegm, cough worsens sore throat.  Fever has continued around 101.  Some nausea.   No vomiting.   No blood in diarrhea, one episode of diarrhea.  No wheezing, no SOB in day, but has cough spells worse at night.  Using Mucinex and Sudafed, Tylenol, Cepacol lozenge. No other aggravating or relieving factor. No other complaint.  Review of systems as in subjective   Past Medical History  Diagnosis Date  . Allergy     RHINITIS  . Migraine headache   . Gastric outlet obstruction 03/2012    Dr. Mamie Laurel for surgical eval  . H. pylori infection 03/2012    CLO biopsy positive at EGD  . Gastroparesis   . Anxiety   . GERD (gastroesophageal reflux disease)      Objective: BP 92/60  Pulse 112  Temp(Src) 99 F (37.2 C) (Oral)  Resp 16  Wt 137 lb (62.143 kg)   General appearance: Alert, WD/WN, no distress, ill appearing, tachycardic                             Skin: warm, no rash, no diaphoresis                           Head: no sinus tenderness                            Eyes: conjunctiva normal, corneas clear, PERRLA                            Ears: pearly TMs, external ear canals normal                          Nose: septum midline, turbinates swollen, with erythema and clear discharge             Mouth/throat: MMM, tongue normal, moderate pharyngeal erythema with post nasal drainage                           Neck: supple, shoddy tender anterior nodes, no thyromegaly, nontender                          Heart: RRR,  normal S1, S2, no murmurs                         Lungs: +bronchial breath sounds, +scattered rhonchi, no wheezes, no rales                Extremities: no edema, nontender     Assessment: Encounter Diagnoses  Name Primary?  . Acute bronchitis, unspecified organism Yes  . Sore throat   . Fever, unspecified      Plan:  Medication orders today include: Zpak, Hycodan syrup.  Discussed diagnosis and treatment of bronchitis.  Suggested symptomatic OTC remedies for cough and congestion.  Tylenol or Ibuprofen OTC for fever and malaise.  Call/return in 2-3 days if symptoms are worse or not improving.  Advised that cough may linger even after the infection is improved.

## 2014-12-04 ENCOUNTER — Other Ambulatory Visit: Payer: Self-pay | Admitting: Family Medicine

## 2015-03-19 ENCOUNTER — Other Ambulatory Visit: Payer: Self-pay | Admitting: Obstetrics & Gynecology

## 2015-03-20 LAB — CYTOLOGY - PAP

## 2015-03-31 ENCOUNTER — Other Ambulatory Visit: Payer: Self-pay | Admitting: Family Medicine

## 2015-05-09 ENCOUNTER — Other Ambulatory Visit: Payer: Self-pay | Admitting: Family Medicine

## 2015-05-30 ENCOUNTER — Encounter: Payer: Self-pay | Admitting: Family Medicine

## 2015-05-30 ENCOUNTER — Ambulatory Visit (INDEPENDENT_AMBULATORY_CARE_PROVIDER_SITE_OTHER): Payer: 59 | Admitting: Family Medicine

## 2015-05-30 VITALS — BP 100/60 | HR 70 | Temp 98.1°F | Wt 146.8 lb

## 2015-05-30 DIAGNOSIS — H9202 Otalgia, left ear: Secondary | ICD-10-CM

## 2015-05-30 DIAGNOSIS — J029 Acute pharyngitis, unspecified: Secondary | ICD-10-CM

## 2015-05-30 LAB — POCT RAPID STREP A (OFFICE): Rapid Strep A Screen: NEGATIVE

## 2015-05-30 NOTE — Progress Notes (Signed)
   Subjective:    Patient ID: Joann Jimenez, female    DOB: [birth year], 54 y.o.   MRN: [id number]  HPI She has a three-day history of sore throat, earache,myalgias and pain with swallowing.PND.No fever, chills, cough or congestion. She has a wedding planned for Saturday   Review of Systems     Objective:   Physical Exam Alert and in no distress. Tympanic membranes and canals are normal. Pharyngeal area is normal. Neck is supple without adenopathy or thyromegaly. Cardiac exam shows a regular sinus rhythm without murmurs or gallops. Lungs are clear to auscultation.  Screen is negative       Assessment & Plan:  Acute pharyngitis, unspecified pharyngitis type - Plan: Rapid Strep A  Otalgia of left ear  Recommend supportive care for this. If she has further difficulties, she will call.

## 2015-07-01 ENCOUNTER — Other Ambulatory Visit: Payer: Self-pay | Admitting: Family Medicine

## 2015-07-03 NOTE — Telephone Encounter (Signed)
Is this okay?

## 2015-09-24 ENCOUNTER — Other Ambulatory Visit: Payer: Self-pay | Admitting: Obstetrics & Gynecology

## 2015-10-19 ENCOUNTER — Encounter: Payer: Self-pay | Admitting: Family Medicine

## 2015-10-19 ENCOUNTER — Ambulatory Visit (INDEPENDENT_AMBULATORY_CARE_PROVIDER_SITE_OTHER): Payer: 59 | Admitting: Family Medicine

## 2015-10-19 ENCOUNTER — Telehealth: Payer: Self-pay | Admitting: Family Medicine

## 2015-10-19 VITALS — BP 114/72 | HR 68 | Temp 98.0°F | Wt 144.2 lb

## 2015-10-19 DIAGNOSIS — J019 Acute sinusitis, unspecified: Secondary | ICD-10-CM | POA: Diagnosis not present

## 2015-10-19 MED ORDER — AMOXICILLIN-POT CLAVULANATE 875-125 MG PO TABS: 1.0000 | ORAL_TABLET | Freq: Two times a day (BID) | ORAL | Status: DC

## 2015-10-19 NOTE — Telephone Encounter (Signed)
Pt called and was requesting that you send her something in for a bad cold, said she has had it for 11 days and that it is getting worse, and that she is coughing of yellow stuff,was wanting a z-pack, pt has not been here since June, i went ahead and made her appt to come in this afternoon to be seen, she said if you did call her something in to please call her so she can cancel her appt. [facility] uses [facility] on [address]. i told her that we like to see the patients to look over them to make sure that we are giving them the right medicine. Pt can be reached at [phone]

## 2015-10-19 NOTE — Telephone Encounter (Signed)
Make sure she keeps the appointment

## 2015-10-19 NOTE — Progress Notes (Signed)
   Subjective:    Patient ID: Joann Jimenez, female    DOB: August 01, 1961, 54 y.o.   MRN: [id number]  HPI She is here for 11 day history of upper respiratory symptoms that started with post nasal drip and dry cough and has progressed to an intermittent productive cough, sore throat, drainage, and sinus pressure. Has been taking mucinex and Sudafed without relief. She states she feels like she is getting worse. Denies fever, chills, ear pain.  She does not smoke. Sick contacts. No recent antibiotic use. She does have underlying allergies and is treating those.   Reviewed allergies, medications, social history.   Review of Systems Pertinent positives and negatives in the history of present illness.    Objective:   Physical Exam  Alert and in no distress. No sinus tenderness. Tympanic membranes and canals are normal. Pharyngeal area is normal. Neck is supple without adenopathy. Cardiac exam shows a regular sinus rhythm without murmurs or gallops. Lungs are clear to auscultation.        Assessment & Plan:  Acute rhinosinusitis  Discussed symptomatic treatment and antibiotic sent to pharmacy. She states she may give her symptoms a couple of more days before starting the antibiotic. Discussed hydration and rest. She will let me know if she is not back to her baseline after completing the antibiotic.

## 2015-10-19 NOTE — Telephone Encounter (Signed)
Pt informed to come in

## 2015-10-25 ENCOUNTER — Telehealth: Payer: Self-pay | Admitting: Family Medicine

## 2015-10-25 NOTE — Telephone Encounter (Signed)
Left message for pt to call me back 

## 2015-10-25 NOTE — Telephone Encounter (Signed)
Pt.notified

## 2015-10-25 NOTE — Telephone Encounter (Signed)
Please call the patient and let her know that we will call her in a Diflucan. If her symptoms do not clear up, tell her to let me know.

## 2015-10-25 NOTE — Telephone Encounter (Signed)
Pt called and stated she was seen last week for a sinus infection, said that was better but now she has a yeast infection and was wondering if you could send her something in. Said she was buring and itching down there, pt uses [facility] on [address] pt can be reached at [phone]

## 2015-10-26 ENCOUNTER — Telehealth: Payer: Self-pay | Admitting: Family Medicine

## 2015-10-26 MED ORDER — FLUCONAZOLE 150 MG PO TABS: 150.0000 mg | ORAL_TABLET | Freq: Once | ORAL | Status: DC

## 2015-10-26 NOTE — Telephone Encounter (Signed)
Pt called stating that pharmacy says they did not get a script for meds for her. Please call pt when meds sent to pharmacy

## 2015-10-26 NOTE — Telephone Encounter (Signed)
Sent med to pharmacy  

## 2015-10-29 ENCOUNTER — Other Ambulatory Visit: Payer: Self-pay | Admitting: Family Medicine

## 2017-01-19 DIAGNOSIS — N950 Postmenopausal bleeding: Secondary | ICD-10-CM | POA: Diagnosis not present

## 2017-01-19 DIAGNOSIS — N390 Urinary tract infection, site not specified: Secondary | ICD-10-CM | POA: Diagnosis not present

## 2017-02-05 DIAGNOSIS — G44219 Episodic tension-type headache, not intractable: Secondary | ICD-10-CM | POA: Diagnosis not present

## 2017-02-05 DIAGNOSIS — G43009 Migraine without aura, not intractable, without status migrainosus: Secondary | ICD-10-CM | POA: Diagnosis not present

## 2017-02-16 DIAGNOSIS — Z91013 Allergy to seafood: Secondary | ICD-10-CM | POA: Diagnosis not present

## 2017-02-16 DIAGNOSIS — J300 Vasomotor rhinitis: Secondary | ICD-10-CM | POA: Diagnosis not present

## 2017-04-06 DIAGNOSIS — Z01419 Encounter for gynecological examination (general) (routine) without abnormal findings: Secondary | ICD-10-CM | POA: Diagnosis not present

## 2017-04-06 DIAGNOSIS — Z6823 Body mass index (BMI) 23.0-23.9, adult: Secondary | ICD-10-CM | POA: Diagnosis not present

## 2017-04-07 ENCOUNTER — Other Ambulatory Visit: Payer: Self-pay | Admitting: Obstetrics & Gynecology

## 2017-04-07 DIAGNOSIS — N6489 Other specified disorders of breast: Secondary | ICD-10-CM

## 2017-04-09 ENCOUNTER — Other Ambulatory Visit: Payer: Self-pay

## 2017-04-23 DIAGNOSIS — G43009 Migraine without aura, not intractable, without status migrainosus: Secondary | ICD-10-CM | POA: Diagnosis not present

## 2017-04-23 DIAGNOSIS — G44219 Episodic tension-type headache, not intractable: Secondary | ICD-10-CM | POA: Diagnosis not present

## 2017-08-06 ENCOUNTER — Ambulatory Visit
Admission: RE | Admit: 2017-08-06 | Discharge: 2017-08-06 | Disposition: A | Discharge status: Home / Self Care | Payer: 59 | Source: Ambulatory Visit | Attending: Obstetrics & Gynecology | Admitting: Obstetrics & Gynecology

## 2017-08-06 ENCOUNTER — Other Ambulatory Visit: Payer: Self-pay | Admitting: Obstetrics & Gynecology

## 2017-08-06 ENCOUNTER — Ambulatory Visit: Payer: Self-pay

## 2017-08-06 DIAGNOSIS — N6489 Other specified disorders of breast: Secondary | ICD-10-CM

## 2017-08-06 DIAGNOSIS — R922 Inconclusive mammogram: Secondary | ICD-10-CM | POA: Diagnosis not present

## 2017-09-08 DIAGNOSIS — D485 Neoplasm of uncertain behavior of skin: Secondary | ICD-10-CM | POA: Diagnosis not present

## 2017-09-08 DIAGNOSIS — L570 Actinic keratosis: Secondary | ICD-10-CM | POA: Diagnosis not present

## 2017-09-08 DIAGNOSIS — L989 Disorder of the skin and subcutaneous tissue, unspecified: Secondary | ICD-10-CM | POA: Diagnosis not present

## 2017-10-14 DIAGNOSIS — Z808 Family history of malignant neoplasm of other organs or systems: Secondary | ICD-10-CM | POA: Diagnosis not present

## 2017-10-14 DIAGNOSIS — Z803 Family history of malignant neoplasm of breast: Secondary | ICD-10-CM | POA: Diagnosis not present

## 2017-10-26 DIAGNOSIS — Z91013 Allergy to seafood: Secondary | ICD-10-CM | POA: Diagnosis not present

## 2017-10-26 DIAGNOSIS — J300 Vasomotor rhinitis: Secondary | ICD-10-CM | POA: Diagnosis not present

## 2017-11-17 ENCOUNTER — Ambulatory Visit: Payer: 59 | Admitting: Family Medicine

## 2018-07-07 DIAGNOSIS — H5212 Myopia, left eye: Secondary | ICD-10-CM | POA: Diagnosis not present

## 2018-07-13 DIAGNOSIS — Z6823 Body mass index (BMI) 23.0-23.9, adult: Secondary | ICD-10-CM | POA: Diagnosis not present

## 2018-07-13 DIAGNOSIS — Z01419 Encounter for gynecological examination (general) (routine) without abnormal findings: Secondary | ICD-10-CM | POA: Diagnosis not present

## 2018-07-15 ENCOUNTER — Other Ambulatory Visit: Payer: Self-pay | Admitting: Obstetrics & Gynecology

## 2018-07-15 DIAGNOSIS — Z1231 Encounter for screening mammogram for malignant neoplasm of breast: Secondary | ICD-10-CM

## 2018-08-10 ENCOUNTER — Ambulatory Visit: Payer: 59

## 2018-12-13 DIAGNOSIS — K219 Gastro-esophageal reflux disease without esophagitis: Secondary | ICD-10-CM | POA: Diagnosis not present

## 2019-02-17 DIAGNOSIS — J1189 Influenza due to unidentified influenza virus with other manifestations: Secondary | ICD-10-CM | POA: Diagnosis not present

## 2019-02-19 DIAGNOSIS — R309 Painful micturition, unspecified: Secondary | ICD-10-CM | POA: Diagnosis not present

## 2019-02-19 DIAGNOSIS — R51 Headache: Secondary | ICD-10-CM | POA: Diagnosis not present

## 2019-02-19 DIAGNOSIS — R1030 Lower abdominal pain, unspecified: Secondary | ICD-10-CM | POA: Diagnosis not present

## 2019-02-23 ENCOUNTER — Ambulatory Visit (INDEPENDENT_AMBULATORY_CARE_PROVIDER_SITE_OTHER): Payer: 59 | Admitting: Medical

## 2019-02-23 ENCOUNTER — Encounter: Payer: Self-pay | Admitting: Medical

## 2019-02-23 VITALS — BP 100/68 | HR 118 | Temp 98.0°F | Resp 16 | Ht 68.0 in | Wt 151.4 lb

## 2019-02-23 DIAGNOSIS — E860 Dehydration: Secondary | ICD-10-CM

## 2019-02-23 DIAGNOSIS — N390 Urinary tract infection, site not specified: Secondary | ICD-10-CM | POA: Diagnosis not present

## 2019-02-23 DIAGNOSIS — R509 Fever, unspecified: Secondary | ICD-10-CM

## 2019-02-23 DIAGNOSIS — R000 Tachycardia, unspecified: Secondary | ICD-10-CM

## 2019-02-23 DIAGNOSIS — R5383 Other fatigue: Secondary | ICD-10-CM | POA: Diagnosis not present

## 2019-02-23 LAB — POCT URINALYSIS DIP (PROADVANTAGE DEVICE)
BILIRUBIN UA: NEGATIVE
BILIRUBIN UA: NEGATIVE mg/dL
Glucose, UA: NEGATIVE mg/dL
NITRITE UA: NEGATIVE
Protein Ur, POC: NEGATIVE mg/dL
Specific Gravity, Urine: 1.01
Urobilinogen, Ur: NEGATIVE
pH, UA: 6 (ref 5.0–8.0)

## 2019-02-23 LAB — POCT INFLUENZA A/B
Influenza A, POC: NEGATIVE
Influenza B, POC: NEGATIVE

## 2019-02-23 NOTE — Patient Instructions (Signed)
Your symptoms and exam today suggest recent either flulike illness or urinary tract infection with dehydration  Recommendations  I recommend you significantly increase your fluid intake over the next few days  I would like you to drink enough fluids until your urine is clear and you are not thirsty  I recommend clear fluids such as water, soup broth, ice chips, ginger ale, Gatorade for example  Rest  We will call with lab results  If you are worse in the next few days such as short of breath, wheezing, coughing up blood, high fever, or chest pain, then call or return or go to the emergency department

## 2019-02-23 NOTE — Progress Notes (Signed)
Subjective:  Joann Jimenez is a 58 y.o. female who presents for illness. Chief Complaint  Patient presents with  . Flu    fever, chills, achey, headache, cough, fatigue X 8 day   She reports aches, fever, extreme lethargy.  Started with glands swollen in neck, pulse felt elevated, achy all over, no appetite, feverish subjectively.  Has had nausea when trying to eat.   Has kept down light meals lately.   No chest pain, no SOB, no leg swelling.  Fever got up to 103.1.  No sick contacts.   Has used alternating ibuprofen and tylenol.   Went to urgent care for possible UTI last week, was put on Macrobid for possible UTI.  At that time was given option of Tamiflu for suspected flu but she didn't take this.   Patient is not a smoker.  She has not drank much water at all up until last 2 days.  Only drinking on average 12 ounces of water per day the last 2 days.  No family hx/o blood clotting disorder.  No personal history of blood clot or DVT.  Not on hormone therapy.  No recent long travel.  She did bump up against a pole that hit her back recently but no other trauma to the body.  No calf pain.  No shortness of breath or wheezing.  No hemoptysis.  She notes that she normally exercises regularly and denies any recent palpitations or chest pains or any other unusual feelings of her heart prior to these last 3 days when she has noticed a little bit of tachycardia  No other aggravating or relieving factors.  No other c/o.  The following portions of the patient's history were reviewed and updated as appropriate: allergies, current medications, past family history, past medical history, past social history, past surgical history and problem list.  ROS as in subjective  Past Medical History:  Diagnosis Date  . Allergy    RHINITIS  . Anxiety   . Gastric outlet obstruction 03/2012   Dr. Mamie Laurel for surgical eval  . Gastroparesis   . GERD (gastroesophageal reflux disease)   . H. pylori  infection 03/2012   CLO biopsy positive at EGD  . Migraine headache    Current Outpatient Medications on File Prior to Visit  Medication Sig Dispense Refill  . dexlansoprazole (DEXILANT) 60 MG capsule Take 60 mg by mouth daily.     . DULoxetine (CYMBALTA) 60 MG capsule Take 60 mg by mouth daily.    Marland Kitchen levocetirizine (XYZAL) 5 MG tablet Take 5 mg by mouth every evening.    . SUMAtriptan (IMITREX) 50 MG tablet Take 50 mg by mouth every 2 (two) hours as needed for migraine. May repeat in 2 hours if headache persists or recurs.    . clonazePAM (KLONOPIN) 0.5 MG tablet Take 0.5 mg by mouth 2 (two) times daily as needed for anxiety.    . fluconazole (DIFLUCAN) 150 MG tablet Take 1 tablet (150 mg total) by mouth once. (Patient not taking: Reported on 02/23/2019) 1 tablet 0  . mirtazapine (REMERON) 15 MG tablet Take 30 mg by mouth at bedtime.     Marland Kitchen ZOMIG 5 MG nasal solution INSTILL 1 SPRAY INTO NOSTRIL ONCE DAILY (Patient not taking: Reported on 02/23/2019) 6 each 1   No current facility-administered medications on file prior to visit.    ROS as in subjective   Objective: BP 100/68   Pulse (!) 118   Temp 98 F (36.7 C) (Oral)  Resp 16   Ht 5\' 8"  (1.727 m)   Wt 151 lb 6.4 oz (68.7 kg)   SpO2 96%   BMI 23.02 kg/m   General appearance: Alert, WD/WN, no distress                             Skin: warm, no rash, no diaphoresis                           Head: no sinus tenderness                            Eyes: conjunctiva normal, corneas clear, PERRLA                            Ears: Flat TMs, external ear canals normal                          Nose: septum midline, turbinates unremarkable, without erythema and no discharge             Mouth/throat: MMM, tongue normal, no pharyngeal erythema                           Neck: supple, no adenopathy, no thyromegaly, nontender no mass                          Heart: Tachycardia otherwise RRR, normal S1, S2, no murmurs                         Lungs:  Clear, no rhonchi, no wheezes, no rales                Extremities: no edema, nontender, no calf tenderness no calf swelling no asymmetry      Assessment: Encounter Diagnoses  Name Primary?  . Fever, unspecified fever cause Yes  . Fatigue, unspecified type   . Tachycardia   . Urinary tract infection without hematuria, site unspecified   . Dehydration      Plan:  We discussed her symptoms, concerns, recent urgent care visit.  We discussed possible causes including recent flulike illness versus urinary tract infection with dehydration.  I do not have access to the urgent care visit notes.  I suspect her tachycardia and fatigue has more to do with her dehydration.  I think she is low risk for PE or DVT.  Urinalysis reviewed, we will check some labs today.  If labs are normal, then we will focus on hydration and rest and gradually recovering back to baseline.  We discussed that I cannot completely rule out PE but I doubt that is the issue today.  I advised if she does start to develop shortness of breath, hemoptysis concerns, or symptoms, chest pain or other, then recheck immediately or go to emergency department   Honesty was seen today for flu.  Diagnoses and all orders for this visit:  Fever, unspecified fever cause -     Influenza A/B -     CBC with Differential/Platelet -     Basic metabolic panel -     TSH  Fatigue, unspecified type -     Influenza A/B -     CBC with  Differential/Platelet -     Basic metabolic panel -     TSH  Tachycardia -     CBC with Differential/Platelet -     Basic metabolic panel -     TSH  Urinary tract infection without hematuria, site unspecified -     CBC with Differential/Platelet -     Basic metabolic panel -     TSH  Dehydration

## 2019-02-24 LAB — CBC WITH DIFFERENTIAL/PLATELET
Basophils Absolute: 0.1 10*3/uL (ref 0.0–0.2)
Basos: 1 %
EOS (ABSOLUTE): 0 10*3/uL (ref 0.0–0.4)
Eos: 0 %
Hematocrit: 36.9 % (ref 34.0–46.6)
Hemoglobin: 12.2 g/dL (ref 11.1–15.9)
LYMPHS ABS: 1.9 10*3/uL (ref 0.7–3.1)
Lymphs: 17 %
MCH: 31.3 pg (ref 26.6–33.0)
MCHC: 33.1 g/dL (ref 31.5–35.7)
MCV: 95 fL (ref 79–97)
Monocytes Absolute: 1.9 10*3/uL — ABNORMAL HIGH (ref 0.1–0.9)
Monocytes: 17 %
NEUTROS ABS: 7.3 10*3/uL — AB (ref 1.4–7.0)
Neutrophils: 65 %
Platelets: 411 10*3/uL (ref 150–450)
RBC: 3.9 x10E6/uL (ref 3.77–5.28)
RDW: 12.4 % (ref 11.7–15.4)
WBC: 11.2 10*3/uL — ABNORMAL HIGH (ref 3.4–10.8)

## 2019-02-24 LAB — BASIC METABOLIC PANEL
BUN/Creatinine Ratio: 19 (ref 9–23)
BUN: 14 mg/dL (ref 6–24)
CO2: 26 mmol/L (ref 20–29)
Calcium: 10.2 mg/dL (ref 8.7–10.2)
Chloride: 96 mmol/L (ref 96–106)
Creatinine, Ser: 0.75 mg/dL (ref 0.57–1.00)
GFR calc Af Amer: 103 mL/min/{1.73_m2} (ref >59)
GFR calc non Af Amer: 89 mL/min/{1.73_m2} (ref >59)
Glucose: 86 mg/dL (ref 65–99)
Potassium: 3.8 mmol/L (ref 3.5–5.2)
SODIUM: 139 mmol/L (ref 134–144)

## 2019-02-24 LAB — TSH: TSH: 0.799 u[IU]/mL (ref 0.450–4.500)

## 2019-02-25 ENCOUNTER — Ambulatory Visit (INDEPENDENT_AMBULATORY_CARE_PROVIDER_SITE_OTHER): Payer: 59 | Admitting: Medical

## 2019-02-25 ENCOUNTER — Encounter: Payer: Self-pay | Admitting: Medical

## 2019-02-25 VITALS — BP 110/80 | HR 92 | Temp 98.3°F | Resp 16 | Wt 154.8 lb

## 2019-02-25 DIAGNOSIS — N390 Urinary tract infection, site not specified: Secondary | ICD-10-CM

## 2019-02-25 DIAGNOSIS — R5383 Other fatigue: Secondary | ICD-10-CM

## 2019-02-25 DIAGNOSIS — R52 Pain, unspecified: Secondary | ICD-10-CM

## 2019-02-25 DIAGNOSIS — R000 Tachycardia, unspecified: Secondary | ICD-10-CM

## 2019-02-25 DIAGNOSIS — R509 Fever, unspecified: Secondary | ICD-10-CM

## 2019-02-25 MED ORDER — SULFAMETHOXAZOLE-TRIMETHOPRIM 800-160 MG PO TABS: 1.0000 | ORAL_TABLET | Freq: Two times a day (BID) | ORAL | 0 refills | Status: DC

## 2019-02-25 NOTE — Patient Instructions (Signed)
Recommendations  Finish out Macrobid, consider starting Bactrim if not improving  Call urgent care back again and demand the culture results as culture results typically take 3 days  Culture results on the urine can show with antibiotics you should be on and to see if Macrobid was sensitive  Increase your water intake to the point where your urine is clear, your skin does not tent and your tongue does not appear dry  Also if you are hydrated up you should not feel dizzy when you stand  It usually takes several days to feel improved coming off of an illness and dehydration  Thus your current symptoms are not unusual given the circumstances this past week  If any worsening over the weekend such as fever over 101, worse pain, uncontrollable nausea or vomiting, then consider going to the emergency department    Dehydration, Adult  Dehydration is when there is not enough fluid or water in your body. This happens when you lose more fluids than you take in. Dehydration can range from mild to very bad. It should be treated right away to keep it from getting very bad. Symptoms of mild dehydration may include:  Thirst.  Dry lips.  Slightly dry mouth.  Dry, warm skin.  Dizziness. Symptoms of moderate dehydration may include:  Very dry mouth.  Muscle cramps.  Dark pee (urine). Pee may be the color of tea.  Your body making less pee.  Your eyes making fewer tears.  Heartbeat that is uneven or faster than normal (palpitations).  Headache.  Light-headedness, especially when you stand up from sitting.  Fainting (syncope). Symptoms of very bad dehydration may include:  Changes in skin, such as: ? Cold and clammy skin. ? Blotchy (mottled) or pale skin. ? Skin that does not quickly return to normal after being lightly pinched and let go (poor skin turgor).  Changes in body fluids, such as: ? Feeling very thirsty. ? Your eyes making fewer tears. ? Not sweating when body  temperature is high, such as in hot weather. ? Your body making very little pee.  Changes in vital signs, such as: ? Weak pulse. ? Pulse that is more than 100 beats a minute when you are sitting still. ? Fast breathing. ? Low blood pressure.  Other changes, such as: ? Sunken eyes. ? Cold hands and feet. ? Confusion. ? Lack of energy (lethargy). ? Trouble waking up from sleep. ? Short-term weight loss. ? Unconsciousness. Follow these instructions at home:   If told by your doctor, drink an ORS: ? Make an ORS by using instructions on the package. ? Start by drinking small amounts, about  cup (120 mL) every 5-10 minutes. ? Slowly drink more until you have had the amount that your doctor said to have.  Drink enough clear fluid to keep your pee clear or pale yellow. If you were told to drink an ORS, finish the ORS first, then start slowly drinking clear fluids. Drink fluids such as: ? Water. Do not drink only water by itself. Doing that can make the salt (sodium) level in your body get too low (hyponatremia). ? Ice chips. ? Fruit juice that you have added water to (diluted). ? Low-calorie sports drinks.  Avoid: ? Alcohol. ? Drinks that have a lot of sugar. These include high-calorie sports drinks, fruit juice that does not have water added, and soda. ? Caffeine. ? Foods that are greasy or have a lot of fat or sugar.  Take over-the-counter and prescription  medicines only as told by your doctor.  Do not take salt tablets. Doing that can make the salt level in your body get too high (hypernatremia).  Eat foods that have minerals (electrolytes). Examples include bananas, oranges, potatoes, tomatoes, and spinach.  Keep all follow-up visits as told by your doctor. This is important. Contact a doctor if:  You have belly (abdominal) pain that: ? Gets worse. ? Stays in one area (localizes).  You have a rash.  You have a stiff neck.  You get angry or annoyed more easily than  normal (irritability).  You are more sleepy than normal.  You have a harder time waking up than normal.  You feel: ? Weak. ? Dizzy. ? Very thirsty.  You have peed (urinated) only a small amount of very dark pee during 6-8 hours. Get help right away if:  You have symptoms of very bad dehydration.  You cannot drink fluids without throwing up (vomiting).  Your symptoms get worse with treatment.  You have a fever.  You have a very bad headache.  You are throwing up or having watery poop (diarrhea) and it: ? Gets worse. ? Does not go away.  You have blood or something green (bile) in your throw-up.  You have blood in your poop (stool). This may cause poop to look black and tarry.  You have not peed in 6-8 hours.  You pass out (faint).  Your heart rate when you are sitting still is more than 100 beats a minute.  You have trouble breathing. This information is not intended to replace advice given to you by your health care provider. Make sure you discuss any questions you have with your health care provider. Document Released: 10/11/2009 Document Revised: 07/04/2016 Document Reviewed: 02/08/2016 Elsevier Interactive Patient Education  2019 [employer].

## 2019-02-25 NOTE — Progress Notes (Signed)
Subjective:  Joann Jimenez is a 58 y.o. female who presents for recheck from visit 2 days ago . Chief Complaint  Patient presents with  . wants ekg   Here with female friend.  Still having fast heart rate, has increased hydration since last visit, and does feel some improvement.  At her visit a few days she reported body aches, fever, extreme lethargy.  Started with glands swollen in neck, pulse felt elevated, achy all over, no appetite, feverish subjectively.  Has had nausea when trying to eat.   Has kept down light meals lately.   No chest pain, no SOB, no leg swelling.  Fever got up to 103.1.  No sick contacts.   Has used alternating ibuprofen and tylenol.   Went to urgent care for possible UTI last week, was put on Macrobid for possible UTI.  At that time was given option of Tamiflu for suspected flu but she didn't take this.   Patient is not a smoker.  She has not drank much water at all up until last 3 days.  Only drinking on average 12 ounces of water per day the last 2 days.  No family hx/o blood clotting disorder.  No personal history of blood clot or DVT.  Not on hormone therapy.  No recent long travel.  She did bump up against a pole that hit her back recently but no other trauma to the body.  No calf pain.  No shortness of breath or wheezing.  No hemoptysis.  She notes that she normally exercises regularly and denies any recent palpitations or chest pains or any other unusual feelings of her heart prior to these last 3 days when she has noticed a little bit of tachycardia  No other aggravating or relieving factors.  No other c/o.  The following portions of the patient's history were reviewed and updated as appropriate: allergies, current medications, past family history, past medical history, past social history, past surgical history and problem list.  ROS as in subjective  Past Medical History:  Diagnosis Date  . Allergy    RHINITIS  . Anxiety   . Gastric outlet  obstruction 03/2012   Dr. Mamie Laurel for surgical eval  . Gastroparesis   . GERD (gastroesophageal reflux disease)   . H. pylori infection 03/2012   CLO biopsy positive at EGD  . Migraine headache    Current Outpatient Medications on File Prior to Visit  Medication Sig Dispense Refill  . dexlansoprazole (DEXILANT) 60 MG capsule Take 60 mg by mouth daily.     . DULoxetine (CYMBALTA) 60 MG capsule Take 60 mg by mouth daily.    Marland Kitchen levocetirizine (XYZAL) 5 MG tablet Take 5 mg by mouth every evening.    . clonazePAM (KLONOPIN) 0.5 MG tablet Take 0.5 mg by mouth 2 (two) times daily as needed for anxiety.    . fluconazole (DIFLUCAN) 150 MG tablet Take 1 tablet (150 mg total) by mouth once. (Patient not taking: Reported on 02/23/2019) 1 tablet 0  . mirtazapine (REMERON) 15 MG tablet Take 30 mg by mouth at bedtime.     . SUMAtriptan (IMITREX) 50 MG tablet Take 50 mg by mouth every 2 (two) hours as needed for migraine. May repeat in 2 hours if headache persists or recurs.    Marland Kitchen ZOMIG 5 MG nasal solution INSTILL 1 SPRAY INTO NOSTRIL ONCE DAILY (Patient not taking: Reported on 02/23/2019) 6 each 1   No current facility-administered medications on file prior to visit.  ROS as in subjective   Objective: BP 110/80   Pulse 92   Temp 98.3 F (36.8 C) (Oral)   Resp 16   Wt 154 lb 12.8 oz (70.2 kg)   SpO2 97%   BMI 23.54 kg/m    Wt Readings from Last 3 Encounters:  02/25/19 154 lb 12.8 oz (70.2 kg)  02/23/19 151 lb 6.4 oz (68.7 kg)  10/19/15 144 lb 3.2 oz (65.4 kg)    General appearance: Alert, WD/WN, no distress                             Skin: warm, no rash, no diaphoresis, mild skin tenting                           Head: no sinus tenderness                            Eyes: conjunctiva normal, corneas clear, PERRLA                            Ears: Flat TMs, external ear canals normal                          Nose: septum midline, turbinates unremarkable, without erythema and no  discharge             Mouth/throat: still somewhat dry mucous membranes, tongue somewhat dry, no pharyngeal erythema                           Neck: supple, no adenopathy, no thyromegaly, nontender no mass                          Heart: RRR, normal S1, S2, no murmurs                         Lungs: Clear, no rhonchi, no wheezes, no rales                Extremities: no edema, nontender, no calf tenderness no calf swelling no asymmetry     EKG Indication: palpitations, rate 88 bpm, PR 124 ms, QRS 26ms, QTC 442, 85 degrees, nsr, left atrial enlargement.   Assessment: Encounter Diagnoses  Name Primary?  . Tachycardia Yes  . Other fatigue   . Fever, unspecified fever cause   . Urinary tract infection without hematuria, site unspecified   . Body aches      Plan:  Reassured that EKG doesn't suggest afib or other more serious issues and pulse is improved today compared to 2 days ago.  We discussed her symptoms, concerns, recent urgent care visit.  symptoms and exam suggest dehydration, urinary tract infection, but some improvements compared to visit 2 days ago.  I do not have access to the urgent care visit notes.  I suspect her tachycardia and fatigue has more to do with her dehydration.  I think she is low risk for PE or DVT.  I reviewed labs from 2 days ago.  At this point, increase hydration even more, finish Macrobid but can begin Bactrim.   Discussed goals of care and gradual improvement over weekend, but if worsening symptoms this weekend, then get  re-evaluated in the ED.   Sharah was seen today for wants ekg.  Diagnoses and all orders for this visit:  Tachycardia -     EKG 12-Lead  Other fatigue  Fever, unspecified fever cause  Urinary tract infection without hematuria, site unspecified  Body aches

## 2020-03-09 ENCOUNTER — Ambulatory Visit: Payer: 59 | Attending: Internal Medicine

## 2020-03-09 DIAGNOSIS — Z23 Encounter for immunization: Secondary | ICD-10-CM

## 2020-03-09 NOTE — Progress Notes (Signed)
   Covid-19 Vaccination Clinic  Name:  Joann Jimenez    MRN: [id number] DOB: 1961/08/12  03/09/2020  Joann Jimenez was observed post Covid-19 immunization for 15 minutes without incident. She was provided with Vaccine Information Sheet and instruction to access the V-Safe system.   Joann Jimenez was instructed to call 911 with any severe reactions post vaccine: Marland Kitchen Difficulty breathing  . Swelling of face and throat  . A fast heartbeat  . A bad rash all over body  . Dizziness and weakness   Immunizations Administered    Name Date Dose VIS Date Route   Moderna COVID-19 Vaccine 03/09/2020  9:51 AM 0.5 mL 11/29/2019 Intramuscular   Manufacturer: Moderna   Lot: [id number]   [id number]

## 2020-04-11 ENCOUNTER — Ambulatory Visit: Payer: 59 | Attending: Internal Medicine

## 2020-04-11 DIAGNOSIS — Z23 Encounter for immunization: Secondary | ICD-10-CM

## 2020-04-11 NOTE — Progress Notes (Signed)
   Covid-19 Vaccination Clinic  Name:  Joann Jimenez    MRN: [id number] DOB: 02/20/1961  04/11/2020  [name] was observed post Covid-19 immunization for 15 minutes without incident. She was provided with Vaccine Information Sheet and instruction to access the V-Safe system.   [name] was instructed to call 911 with any severe reactions post vaccine: Marland Kitchen Difficulty breathing  . Swelling of face and throat  . A fast heartbeat  . A bad rash all over body  . Dizziness and weakness   Immunizations Administered    Name Date Dose VIS Date Route   Moderna COVID-19 Vaccine 04/11/2020  9:14 AM 0.5 mL 11/29/2019 Intramuscular   Manufacturer: Moderna   Lot: [id number]   [id number]

## 2020-07-13 ENCOUNTER — Other Ambulatory Visit: Payer: Self-pay | Admitting: Obstetrics & Gynecology

## 2020-07-13 DIAGNOSIS — Z1231 Encounter for screening mammogram for malignant neoplasm of breast: Secondary | ICD-10-CM

## 2020-07-27 ENCOUNTER — Other Ambulatory Visit: Payer: Self-pay

## 2020-07-27 ENCOUNTER — Ambulatory Visit
Admission: RE | Admit: 2020-07-27 | Discharge: 2020-07-27 | Disposition: A | Discharge status: Home / Self Care | Payer: 59 | Source: Ambulatory Visit | Attending: Obstetrics & Gynecology | Admitting: Obstetrics & Gynecology

## 2020-07-27 DIAGNOSIS — Z1231 Encounter for screening mammogram for malignant neoplasm of breast: Secondary | ICD-10-CM

## 2020-08-23 LAB — HM PAP SMEAR

## 2020-11-28 ENCOUNTER — Encounter: Payer: Self-pay | Admitting: Family Medicine

## 2020-11-28 ENCOUNTER — Ambulatory Visit (INDEPENDENT_AMBULATORY_CARE_PROVIDER_SITE_OTHER): Payer: 59 | Admitting: Family Medicine

## 2020-11-28 ENCOUNTER — Other Ambulatory Visit: Payer: Self-pay

## 2020-11-28 VITALS — BP 110/70 | HR 68 | Temp 98.4°F | Ht 67.0 in | Wt 153.2 lb

## 2020-11-28 DIAGNOSIS — R350 Frequency of micturition: Secondary | ICD-10-CM

## 2020-11-28 DIAGNOSIS — N3000 Acute cystitis without hematuria: Secondary | ICD-10-CM | POA: Diagnosis not present

## 2020-11-28 DIAGNOSIS — Z23 Encounter for immunization: Secondary | ICD-10-CM | POA: Diagnosis not present

## 2020-11-28 LAB — POCT URINALYSIS DIP (PROADVANTAGE DEVICE)
Bilirubin, UA: NEGATIVE
Glucose, UA: NEGATIVE mg/dL
Ketones, POC UA: NEGATIVE mg/dL
Nitrite, UA: NEGATIVE
Specific Gravity, Urine: 1.03
Urobilinogen, Ur: NEGATIVE
pH, UA: 5.5 (ref 5.0–8.0)

## 2020-11-28 MED ORDER — SULFAMETHOXAZOLE-TRIMETHOPRIM 800-160 MG PO TABS: 1.0000 | ORAL_TABLET | Freq: Two times a day (BID) | ORAL | 0 refills | Status: DC

## 2020-11-28 NOTE — Progress Notes (Signed)
Chief Complaint  Patient presents with  . Urinary Frequency    only symptom is feeling like she has to go all the time and it started 2 days ago. Took a diflucan Sunday thinking she may have had a yeast infection.     She is complaining of 2 days of urinary frequency.  Denies dysuria, hematuria. She has some suprapubic pressure/discomfort.  Denies flank pain.  She has h/o UTI's.  Feels similar to the UTI's in the past several years. Has had mild symptoms in the past, and ultimately developed pyelonephritis (twice) When younger she had the more classic symptoms (dysuria).  Last UTI 01/2019.  Treated with macrobid, then changed to Septra.  Not seen in office frequently (last was in 01/2019 for that illness). She sees GYN regularly.  PMH, PSH, SH reviewed  Outpatient Encounter Medications as of 11/28/2020  Medication Sig  . dexlansoprazole (DEXILANT) 60 MG capsule Take 60 mg by mouth daily.   . DULoxetine (CYMBALTA) 60 MG capsule Take 60 mg by mouth daily.  . fluticasone (FLONASE) 50 MCG/ACT nasal spray Place 1 spray into both nostrils daily.  . [DISCONTINUED] fluconazole (DIFLUCAN) 150 MG tablet Take 1 tablet (150 mg total) by mouth once.  . clonazePAM (KLONOPIN) 0.5 MG tablet Take 0.5 mg by mouth 2 (two) times daily as needed for anxiety. (Patient not taking: Reported on 11/28/2020)  . levocetirizine (XYZAL) 5 MG tablet Take 5 mg by mouth every evening. (Patient not taking: Reported on 11/28/2020)  . SUMAtriptan (IMITREX) 50 MG tablet Take 50 mg by mouth every 2 (two) hours as needed for migraine. May repeat in 2 hours if headache persists or recurs. (Patient not taking: Reported on 11/28/2020)  . [DISCONTINUED] eletriptan (RELPAX) 40 MG tablet SMARTSIG:1 Tablet(s) By Mouth 1 to 2 Times Daily (Patient not taking: Reported on 11/28/2020)  . [DISCONTINUED] mirtazapine (REMERON) 15 MG tablet Take 30 mg by mouth at bedtime.  (Patient not taking: Reported on 11/28/2020)  . [DISCONTINUED]  sulfamethoxazole-trimethoprim (BACTRIM DS,SEPTRA DS) 800-160 MG tablet Take 1 tablet by mouth 2 (two) times daily.  . [DISCONTINUED] ZOMIG 5 MG nasal solution INSTILL 1 SPRAY INTO NOSTRIL ONCE DAILY (Patient not taking: Reported on 02/23/2019)   No facility-administered encounter medications on file as of 11/28/2020.   Uses Lunesta prn (unsure of dose).  No Known Allergies  ROS:no fever, chills, dizziness, headache, nausea, vomiting, diarrhea, vaginal discharge, odor/itch, vaginal bleeding.  Urinary complaints per HPI. No URI complaints. See HPI   PHYSICAL EXAM:  BP 110/70   Pulse 68   Temp 98.4 F (36.9 C) (Tympanic)   Ht 5\' 7"  (1.702 m)   Wt 153 lb 3.2 oz (69.5 kg)   LMP 05/08/2013   BMI 23.99 kg/m   Pleasant, well-appearing female in no distress HEENT: conjunctiva and sclera are clear, EOMI, wearing mask Heart: regular rate and rhythm Lungs: clear bilaterally Back: no CVA tenderness Abdomen: soft, nontender, no organomegaly or mass Extremities: no edema.  Urine dip: SG 1.030, large leuks, trace protein, small blood   ASSESSMENT/PLAN:  Acute cystitis without hematuria - Plan: Urine Culture, sulfamethoxazole-trimethoprim (BACTRIM DS) 800-160 MG tablet  Need for influenza vaccination - Plan: Flu Vaccine QUAD 6+ mos PF IM (Fluarix Quad PF)  Urinary frequency - Plan: POCT Urinalysis DIP (Proadvantage Device)  Discussed increasing fluid intake, take ABX as directed. Contact us if symptoms not improving after 2 days or not completely resolving. May use Azo x 2 days if needed (doesn't sound like it is currently needed).  Reviewed s/sx pyelo, to f/u immediately if worsening symptoms.  Reviewed HM issues--UTD on colonoscopy, immunizations. Discussed care gaps of HepC and HIV screening, unsure if these have been checked by her GYN.

## 2020-11-28 NOTE — Patient Instructions (Signed)

## 2020-11-30 ENCOUNTER — Encounter: Payer: Self-pay | Admitting: Family Medicine

## 2020-12-02 LAB — URINE CULTURE

## 2021-05-17 ENCOUNTER — Telehealth: Payer: Self-pay

## 2021-05-17 NOTE — Telephone Encounter (Signed)
Pt. Called stating that she wanted to know if you would take her son Joann Jimenez as a pt. His doctor has retired Dr. Karleen Dolphin.

## 2021-05-18 NOTE — Telephone Encounter (Signed)
yes

## 2021-05-20 NOTE — Telephone Encounter (Signed)
I called Joann Jimenez back and LM to call office back to get pt. Scheduled as a new pt.

## 2021-06-07 ENCOUNTER — Encounter: Payer: Self-pay | Admitting: Family Medicine

## 2021-06-29 ENCOUNTER — Other Ambulatory Visit: Payer: Self-pay | Admitting: Family Medicine

## 2021-06-29 MED ORDER — ONDANSETRON 4 MG PO TBDP: 4.0000 mg | ORAL_TABLET | Freq: Three times a day (TID) | ORAL | 0 refills | Status: DC | PRN

## 2021-06-29 NOTE — Progress Notes (Signed)
Patient has Covid and  nausea from it

## 2022-09-03 ENCOUNTER — Encounter: Payer: Self-pay | Admitting: Internal Medicine

## 2022-10-20 ENCOUNTER — Encounter: Payer: Self-pay | Admitting: Internal Medicine

## 2022-11-06 ENCOUNTER — Other Ambulatory Visit: Payer: Self-pay | Admitting: Family Medicine

## 2022-11-06 DIAGNOSIS — Z1231 Encounter for screening mammogram for malignant neoplasm of breast: Secondary | ICD-10-CM

## 2023-01-06 ENCOUNTER — Encounter: Payer: Self-pay | Admitting: Internal Medicine

## 2023-01-07 ENCOUNTER — Ambulatory Visit
Admission: RE | Admit: 2023-01-07 | Discharge: 2023-01-07 | Disposition: A | Discharge status: Home / Self Care | Payer: 59 | Source: Ambulatory Visit | Attending: Family Medicine | Admitting: Family Medicine

## 2023-01-07 DIAGNOSIS — Z1231 Encounter for screening mammogram for malignant neoplasm of breast: Secondary | ICD-10-CM

## 2023-02-19 LAB — HM COLONOSCOPY

## 2024-02-23 ENCOUNTER — Encounter: Payer: Self-pay | Admitting: Internal Medicine

## 2024-03-03 ENCOUNTER — Encounter: Payer: Self-pay | Admitting: Family Medicine

## 2024-03-08 ENCOUNTER — Other Ambulatory Visit: Payer: Self-pay | Admitting: Family Medicine

## 2024-03-08 DIAGNOSIS — Z1231 Encounter for screening mammogram for malignant neoplasm of breast: Secondary | ICD-10-CM

## 2024-03-30 ENCOUNTER — Ambulatory Visit

## 2024-03-30 ENCOUNTER — Ambulatory Visit
Admission: RE | Admit: 2024-03-30 | Discharge: 2024-03-30 | Disposition: A | Discharge status: Home / Self Care | Source: Ambulatory Visit | Attending: Family Medicine | Admitting: Family Medicine

## 2024-03-30 DIAGNOSIS — Z1231 Encounter for screening mammogram for malignant neoplasm of breast: Secondary | ICD-10-CM

## 2024-05-09 ENCOUNTER — Ambulatory Visit: Payer: Self-pay

## 2024-05-09 ENCOUNTER — Telehealth (INDEPENDENT_AMBULATORY_CARE_PROVIDER_SITE_OTHER): Admitting: Medical

## 2024-05-09 VITALS — HR 77 | Wt 150.0 lb

## 2024-05-09 DIAGNOSIS — R059 Cough, unspecified: Secondary | ICD-10-CM

## 2024-05-09 DIAGNOSIS — J988 Other specified respiratory disorders: Secondary | ICD-10-CM

## 2024-05-09 MED ORDER — AZITHROMYCIN 500 MG PO TABS: 500.0000 mg | ORAL_TABLET | Freq: Every day | ORAL | 0 refills | Status: AC

## 2024-05-09 NOTE — Telephone Encounter (Signed)
  Chief Complaint: sore throat Symptoms: sore throat, chills, fever Frequency: constant Pertinent Negatives: Patient denies fever at this time, difficulty breathing, difficulty swallowing Disposition: [] ED /[] Urgent Care (no appt availability in office) / [x] Appointment(In office/virtual)/ []  [facility]/ [] Home Care/ [] Refused Recommended Disposition /[] [facility]/ []  Follow-up with PCP Additional Notes:  Last Monday developed fever, sore throat, cough, congestion, fatigue. Fever has resolved, all other symptoms persists. Sore throat resolving.  Cough and congestion persistent and constant. Leaving out of town on Thursday. Acute visit advised. This Clinical research associate unable to schedule to system issue, caller transferred to [facility] for scheduling.   Copied from CRM [id number]. Topic: Clinical - Red Word Triage >> May 09, 2024 11:23 AM Marissa P wrote: Red Word that prompted transfer to Nurse Triage: Patient called says she started with having a fever, chills and the sore throat got worse and she would like to get checked as soon possible to see if turned into anything worse and maybe some antibiotics. Reason for Disposition  [1] Continuous (nonstop) coughing interferes with work or school AND [2] no improvement using cough treatment per Care Advice  Protocols used: Cough - Acute Non-Productive-A-AH

## 2024-05-09 NOTE — Progress Notes (Signed)
 Subjective:     Patient ID: Joann Jimenez, female   DOB: 05-20-1961, 63 y.o.   MRN: [id number]  This visit type was conducted due to national recommendations for restrictions regarding the COVID-19 Pandemic (e.g. social distancing) in an effort to limit this patient's exposure and mitigate transmission in our community.  Due to their co-morbid illnesses, this patient is at least at moderate risk for complications without adequate follow up.  This format is felt to be most appropriate for this patient at this time.    Documentation for virtual audio and video telecommunications through [facility] encounter:  The patient was located at home. The provider was located in the office. The patient did consent to this visit and is aware of possible charges through their insurance for this visit.  The other persons participating in this telemedicine service were none. Time spent on call was 20 minutes and in review of previous records 20 minutes total.  This virtual service is not related to other E/M service within previous 7 days.   HPI Chief Complaint  Patient presents with   Acute Visit    Flu like symptoms, achy, fever, sore throat, cough since last week, fatigue   Virtual for flu like symptoms.    Started 8 days ago.  Has had body aches, cough, congestion, sore throat, fatigue.   No sob or wheezing, no fever.  No NVD.   Getting some colored mucous x 3 days.   Mostly productive mucous in the morning.  Using mucinex multi symptom.   Water intake is good.  Nonsmoker.  No covid test. No home flu test.    Has some improvement, but wanted to call in because later this week will be gone for 10 days traveling.    Husband had flu a month ago, illness lasted a month.    No hx/o asthma or lung disease.   No other aggravating or relieving factors. No other complaint.  Past Medical History:  Diagnosis Date   Allergy    RHINITIS   Anxiety    Gastric outlet obstruction 03/2012   Dr.  Alvan August for surgical eval   Gastroparesis    GERD (gastroesophageal reflux disease)    H. pylori infection 03/2012   CLO biopsy positive at EGD   Migraine headache    Current Outpatient Medications on File Prior to Visit  Medication Sig Dispense Refill   dexlansoprazole (DEXILANT) 60 MG capsule Take 60 mg by mouth daily.      DULoxetine (CYMBALTA) 60 MG capsule Take 60 mg by mouth daily.     levocetirizine (XYZAL) 5 MG tablet Take 5 mg by mouth every evening.     eletriptan (RELPAX) 40 MG tablet TAKE 1 TABLET BY MOUTH ONCE DAILY FOR MIGRAINE. MAY REPEAT IN 1 HOUR IF HEADACHE DOES NOT RESOLVE.     fluticasone (FLONASE) 50 MCG/ACT nasal spray Place 1 spray into both nostrils daily. (Patient not taking: Reported on 05/09/2024)     No current facility-administered medications on file prior to visit.     Review of Systems As in subjective    Objective:   Physical Exam Due to coronavirus pandemic stay at home measures, patient visit was virtual and they were not examined in person.   Gen: wd, wn, nad No labored breathign or wheezing     Assessment:     Encounter Diagnoses  Name Primary?   Cough, unspecified type Yes   Respiratory tract infection         Plan:  We discussed symptoms and concerns.  She is actually improving and not quite as far along as she would like.  Advise she continue her current over-the-counter multi symptom Mucinex, good hydration, relative rest.  Tylenol as needed for pain or fever.  We discussed that if symptoms do not seem to improve the next few days more or more consistent colored mucus throughout the day then begin the azithromycin  antibiotic below  [facility] "Fredrik Jensen" was seen today for acute visit.  Diagnoses and all orders for this visit:  Cough, unspecified type  Respiratory tract infection  Other orders -     azithromycin  (ZITHROMAX ) 500 MG tablet; Take 1 tablet (500 mg total) by mouth daily.    F/u prn

## 2024-06-21 ENCOUNTER — Ambulatory Visit (INDEPENDENT_AMBULATORY_CARE_PROVIDER_SITE_OTHER): Admitting: Family Medicine

## 2024-06-21 ENCOUNTER — Ambulatory Visit: Admitting: Family Medicine

## 2024-06-21 VITALS — BP 126/90 | HR 89

## 2024-06-21 DIAGNOSIS — M25561 Pain in right knee: Secondary | ICD-10-CM

## 2024-06-21 DIAGNOSIS — M1990 Unspecified osteoarthritis, unspecified site: Secondary | ICD-10-CM

## 2024-06-21 MED ORDER — CELECOXIB 200 MG PO CAPS
200.0000 mg | ORAL_CAPSULE | Freq: Two times a day (BID) | ORAL | 1 refills | Status: AC
Start: 2024-06-21 — End: ?

## 2024-06-21 NOTE — Progress Notes (Signed)
   Subjective:    Patient ID: Joann Jimenez, female    DOB: 07/14/1961, 63 y.o.   MRN: [id number]  HPI She states that Friday she was training her dog and as part of that process was kicking the ground that was uneven with her legs and did note some right knee pain and swelling after that.  She does have a previous history of arthritis in that knee and does have x-rays that show that.  She has had difficulty with the past with flaring of the knee pain but the symptoms would go away relatively quickly.  No popping or locking.  She also has a history of ulcer disease.  Review of Systems     Objective:    Physical Exam Right knee exam does show a moderate effusion.  Ligaments are intact.  Negative anterior drawer and McMurray's testing.  No point tenderness.  She did show me x-rays on her phone indicating arthritis.       Assessment & Plan:  Acute pain of right knee - Plan: celecoxib (CELEBREX) 200 MG capsule  Arthritis I discussed options with her concerning using Celebrex as well as Prilosec and see what that does versus potentially drawing some fluid off and giving her a steroid injection into the knee.  We will start with the Celebrex as well as Prilosec and then proceed on in 10 days if still having difficulty.  She was comfortable
